# Patient Record
Sex: Female | Born: 1940 | Race: White | Hispanic: No | Marital: Married | Smoking: Never smoker
Health system: Southern US, Community
[De-identification: ages and names within clinical notes are randomized; demographics above are authoritative.]

## PROBLEM LIST (undated history)

## (undated) DIAGNOSIS — M1990 Unspecified osteoarthritis, unspecified site: Secondary | ICD-10-CM

## (undated) DIAGNOSIS — I219 Acute myocardial infarction, unspecified: Secondary | ICD-10-CM

## (undated) DIAGNOSIS — I2510 Atherosclerotic heart disease of native coronary artery without angina pectoris: Secondary | ICD-10-CM

## (undated) DIAGNOSIS — E7800 Pure hypercholesterolemia, unspecified: Secondary | ICD-10-CM

## (undated) DIAGNOSIS — F419 Anxiety disorder, unspecified: Secondary | ICD-10-CM

## (undated) DIAGNOSIS — I10 Essential (primary) hypertension: Secondary | ICD-10-CM

## (undated) HISTORY — DX: Unspecified osteoarthritis, unspecified site: M19.90

## (undated) HISTORY — PX: ANTERIOR (CYSTOCELE) AND POSTERIOR REPAIR (RECTOCELE) WITH XENFORM GRAFT AND SACROSPINOUS FIXATION: SHX6492

## (undated) HISTORY — DX: Anxiety disorder, unspecified: F41.9

## (undated) HISTORY — PX: CORONARY ANGIOPLASTY WITH STENT PLACEMENT: SHX49

## (undated) HISTORY — PX: BREAST CYST EXCISION: SHX579

## (undated) HISTORY — PX: VAGINAL HYSTERECTOMY: SUR661

## (undated) HISTORY — PX: CATARACT EXTRACTION: SUR2

## (undated) HISTORY — PX: ANTERIOR AND POSTERIOR VAGINAL REPAIR W/ SACROSPINOUS LIGAMENT SUSPENSION: SUR6

## (undated) HISTORY — PX: INCONTINENCE SURGERY: SHX676

## (undated) HISTORY — DX: Atherosclerotic heart disease of native coronary artery without angina pectoris: I25.10

---

## 2009-11-24 ENCOUNTER — Emergency Department (HOSPITAL_COMMUNITY): Admission: EM | Admit: 2009-11-24 | Discharge: 2009-11-24 | Payer: Self-pay | Admitting: Emergency Medicine

## 2011-01-01 ENCOUNTER — Emergency Department (HOSPITAL_BASED_OUTPATIENT_CLINIC_OR_DEPARTMENT_OTHER): Payer: Self-pay

## 2011-01-01 ENCOUNTER — Emergency Department (INDEPENDENT_AMBULATORY_CARE_PROVIDER_SITE_OTHER): Payer: Medicare Other

## 2011-01-01 ENCOUNTER — Emergency Department (HOSPITAL_BASED_OUTPATIENT_CLINIC_OR_DEPARTMENT_OTHER): Admission: EM | Admit: 2011-01-01 | Payer: Self-pay | Source: Home / Self Care

## 2011-01-01 ENCOUNTER — Encounter: Payer: Self-pay | Admitting: Family Medicine

## 2011-01-01 ENCOUNTER — Emergency Department (HOSPITAL_BASED_OUTPATIENT_CLINIC_OR_DEPARTMENT_OTHER)
Admission: EM | Admit: 2011-01-01 | Discharge: 2011-01-01 | Disposition: A | Discharge status: Home / Self Care | Payer: Medicare Other | Attending: Emergency Medicine | Admitting: Emergency Medicine

## 2011-01-01 DIAGNOSIS — R079 Chest pain, unspecified: Secondary | ICD-10-CM | POA: Insufficient documentation

## 2011-01-01 DIAGNOSIS — R0789 Other chest pain: Secondary | ICD-10-CM

## 2011-01-01 DIAGNOSIS — Z79899 Other long term (current) drug therapy: Secondary | ICD-10-CM | POA: Insufficient documentation

## 2011-01-01 DIAGNOSIS — I252 Old myocardial infarction: Secondary | ICD-10-CM | POA: Insufficient documentation

## 2011-01-01 DIAGNOSIS — I10 Essential (primary) hypertension: Secondary | ICD-10-CM | POA: Insufficient documentation

## 2011-01-01 DIAGNOSIS — W19XXXA Unspecified fall, initial encounter: Secondary | ICD-10-CM

## 2011-01-01 DIAGNOSIS — W010XXA Fall on same level from slipping, tripping and stumbling without subsequent striking against object, initial encounter: Secondary | ICD-10-CM | POA: Insufficient documentation

## 2011-01-01 DIAGNOSIS — E7800 Pure hypercholesterolemia, unspecified: Secondary | ICD-10-CM | POA: Insufficient documentation

## 2011-01-01 DIAGNOSIS — S20219A Contusion of unspecified front wall of thorax, initial encounter: Secondary | ICD-10-CM | POA: Insufficient documentation

## 2011-01-01 HISTORY — DX: Pure hypercholesterolemia, unspecified: E78.00

## 2011-01-01 HISTORY — DX: Acute myocardial infarction, unspecified: I21.9

## 2011-01-01 HISTORY — DX: Essential (primary) hypertension: I10

## 2011-01-01 MED ORDER — HYDROCODONE-ACETAMINOPHEN 5-325 MG PO TABS
1.0000 | ORAL_TABLET | Freq: Once | ORAL | Status: DC
  Filled 2011-01-01: qty 1

## 2011-01-01 MED ORDER — IBUPROFEN 400 MG PO TABS
600.0000 mg | ORAL_TABLET | Freq: Once | ORAL | Status: AC
  Administered 2011-01-01: 600 mg via ORAL
  Filled 2011-01-01: qty 1

## 2011-01-01 MED ORDER — HYDROCODONE-ACETAMINOPHEN 5-325 MG PO TABS: 1.0000 | ORAL_TABLET | Freq: Four times a day (QID) | ORAL | Status: AC | PRN

## 2011-01-01 NOTE — ED Notes (Signed)
Pt sts she "slipped and fell" injuring right ribs yesterday. Pt sts she also hit right side of head but denies loc, denies dizziness, headache. Pt able to ambulate.

## 2011-01-01 NOTE — ED Provider Notes (Signed)
History     CSN: [id number] Arrival date & time: 01/01/2011  8:18 AM   First MD Initiated Contact with Patient 01/01/11 0827      Chief Complaint  Patient presents with  . Fall     HPI  70 year old female presents with right rib pain after fall. Patient states that she slipped on a wet floor at her house. She fell onto her right side, mostly in her shoulder ribs. She states that she did her head but "not hard". There is no loss of consciousness/dizziness/headache. Denies nausea, vomiting. There is no neck pain, back pain. The patient is complaining of pain under right breast radiating to her right flank. Worse with movement and with breathing. The patient denies numbness tingling weakness of her extremities. She denies shortness of breath.  Denies headache, dizziness, cp, palpitations, shortness of breath pre fall and currently. No neck pain or back pain. Denies hip pain. Has been ambulatory since incident. Feels safe at home.    Letta Median, RN 01/01/2011 08:19     Pt sts she "slipped and fell" injuring right ribs yesterday. Pt sts she also hit right side of head but denies loc, denies dizziness, headache. Pt able to ambulate.     Past Medical History  Diagnosis Date  . Hypertension   . High cholesterol   . MI (myocardial infarction)     History reviewed. No pertinent past surgical history.  No family history on file.  History  Substance Use Topics  . Smoking status: Never Smoker   . Smokeless tobacco: Not on file  . Alcohol Use: No    OB History    Grav Para Term Preterm Abortions TAB SAB Ect Mult Living                  Review of Systems except as noted HPI  Allergies  Penicillins  Home Medications   Current Outpatient Rx  Name Route Sig Dispense Refill  . ALPRAZOLAM 0.25 MG PO TABS Oral Take 0.25 mg by mouth at bedtime as needed.      Marland Kitchen DICLOFENAC-MISOPROSTOL 75-0.2 MG PO TABS Oral Take 1 tablet by mouth 2 (two) times daily.      Marland Kitchen METOPROLOL  SUCCINATE 50 MG PO TB24 Oral Take 50 mg by mouth daily.      Marland Kitchen NITROGLYCERIN 0.4 MG SL SUBL Sublingual Place 0.4 mg under the tongue every 5 (five) minutes as needed.      Marland Kitchen RABEPRAZOLE SODIUM 20 MG PO TBEC Oral Take 20 mg by mouth daily.      Marland Kitchen RAMIPRIL 2.5 MG PO CAPS Oral Take 2.5 mg by mouth daily.      Marland Kitchen ROSUVASTATIN CALCIUM 10 MG PO TABS Oral Take 10 mg by mouth daily.      Marland Kitchen HYDROCODONE-ACETAMINOPHEN 5-325 MG PO TABS Oral Take 1 tablet by mouth every 6 (six) hours as needed for pain. 10 tablet 0    BP 144/67  Pulse 70  Temp(Src) 97.6 F (36.4 C) (Oral)  Resp 16  Ht 5\' 1"  (1.549 m)  Wt 141 lb (63.957 kg)  BMI 26.64 kg/m2  SpO2 100%  Physical Exam  Nursing note and vitals reviewed. Constitutional: She is oriented to person, place, and time. She appears well-developed.  HENT:  Head: Atraumatic.  Mouth/Throat: Oropharynx is clear and moist.  Eyes: Conjunctivae and EOM are normal. Pupils are equal, round, and reactive to light.  Neck: Normal range of motion. Neck supple.  Cardiovascular: Normal rate, regular  rhythm, normal heart sounds and intact distal pulses.   Pulmonary/Chest: Effort normal and breath sounds normal. No respiratory distress. She has no wheezes. She has no rales. She exhibits tenderness.       Diffuse Rt lower rib ttp  Abdominal: Soft. She exhibits no distension. There is no tenderness. There is no rebound and no guarding.  Musculoskeletal: Normal range of motion.  Neurological: She is alert and oriented to person, place, and time.  Skin: Skin is warm and dry. No rash noted.  Psychiatric: She has a normal mood and affect.    ED Course  Procedures (including critical care time)  Labs Reviewed - No data to display Dg Ribs Unilateral W/chest Right  01/01/2011  *RADIOLOGY REPORT*  Clinical Data: Fall.  Right sided rib and chest pain.  RIGHT RIBS AND CHEST - 3+ VIEW  Comparison: 11/24/2009  Findings: No acute right-sided rib fractures are identified. Old  fracture deformity of the right posterior 4th rib is again demonstrated.  No evidence of pneumothorax or hemothorax.  Both lungs are clear.  Heart size and mediastinal contours are stable and within normal limits.  Coronary artery stent again noted.  IMPRESSION:  1.  No acute right rib fractures identified.  Old right 4th rib fracture deformity again noted. 2.  No active cardiopulmonary disease.  Original Report Authenticated By: Danae Orleans, M.D.     1. Rib contusion   2. Fall       MDM  S/p mechanical fall with rib contusion. Pain control, IS. Home with PMD f/u  Stefano Gaul, MD         Forbes Cellar, MD 01/01/11 [id number]

## 2011-01-01 NOTE — ED Notes (Signed)
Pt refused IS. RN and MD aware.  States she can breath just fine no problems.  Explained to her it is important to deep breath and cough.

## 2011-01-01 NOTE — ED Notes (Signed)
Pt refused vicodin, MD aware and orders changed.

## 2014-11-28 DIAGNOSIS — I2510 Atherosclerotic heart disease of native coronary artery without angina pectoris: Secondary | ICD-10-CM | POA: Insufficient documentation

## 2014-11-28 DIAGNOSIS — E782 Mixed hyperlipidemia: Secondary | ICD-10-CM | POA: Insufficient documentation

## 2014-11-28 HISTORY — DX: Mixed hyperlipidemia: E78.2

## 2014-11-28 HISTORY — DX: Atherosclerotic heart disease of native coronary artery without angina pectoris: I25.10

## 2014-11-29 DIAGNOSIS — N390 Urinary tract infection, site not specified: Secondary | ICD-10-CM

## 2014-11-29 DIAGNOSIS — N393 Stress incontinence (female) (male): Secondary | ICD-10-CM

## 2014-11-29 DIAGNOSIS — N816 Rectocele: Secondary | ICD-10-CM

## 2014-11-29 DIAGNOSIS — N8110 Cystocele, unspecified: Secondary | ICD-10-CM | POA: Insufficient documentation

## 2014-11-29 HISTORY — DX: Rectocele: N81.6

## 2014-11-29 HISTORY — DX: Stress incontinence (female) (male): N39.3

## 2014-11-29 HISTORY — DX: Cystocele, unspecified: N81.10

## 2014-11-29 HISTORY — DX: Urinary tract infection, site not specified: N39.0

## 2015-07-01 DIAGNOSIS — E559 Vitamin D deficiency, unspecified: Secondary | ICD-10-CM

## 2015-07-01 DIAGNOSIS — F5101 Primary insomnia: Secondary | ICD-10-CM

## 2015-07-01 HISTORY — DX: Primary insomnia: F51.01

## 2015-07-01 HISTORY — DX: Vitamin D deficiency, unspecified: E55.9

## 2016-01-07 DIAGNOSIS — K219 Gastro-esophageal reflux disease without esophagitis: Secondary | ICD-10-CM | POA: Insufficient documentation

## 2016-01-07 DIAGNOSIS — Z13820 Encounter for screening for osteoporosis: Secondary | ICD-10-CM

## 2016-01-07 HISTORY — DX: Encounter for screening for osteoporosis: Z13.820

## 2016-01-07 HISTORY — DX: Gastro-esophageal reflux disease without esophagitis: K21.9

## 2016-06-22 ENCOUNTER — Ambulatory Visit (HOSPITAL_BASED_OUTPATIENT_CLINIC_OR_DEPARTMENT_OTHER)
Admission: RE | Admit: 2016-06-22 | Discharge: 2016-06-22 | Disposition: A | Discharge status: Home / Self Care | Payer: Medicare Other | Source: Ambulatory Visit | Attending: Chiropractic Medicine | Admitting: Chiropractic Medicine

## 2016-06-22 ENCOUNTER — Other Ambulatory Visit (HOSPITAL_BASED_OUTPATIENT_CLINIC_OR_DEPARTMENT_OTHER): Payer: Self-pay | Admitting: Chiropractic Medicine

## 2016-06-22 DIAGNOSIS — M9905 Segmental and somatic dysfunction of pelvic region: Secondary | ICD-10-CM | POA: Insufficient documentation

## 2016-06-22 DIAGNOSIS — M25551 Pain in right hip: Secondary | ICD-10-CM

## 2016-06-22 DIAGNOSIS — R262 Difficulty in walking, not elsewhere classified: Secondary | ICD-10-CM

## 2016-07-09 DIAGNOSIS — M25551 Pain in right hip: Secondary | ICD-10-CM | POA: Insufficient documentation

## 2016-11-17 ENCOUNTER — Encounter: Payer: Self-pay | Admitting: Cardiology

## 2016-11-17 ENCOUNTER — Ambulatory Visit (INDEPENDENT_AMBULATORY_CARE_PROVIDER_SITE_OTHER): Payer: Medicare Other | Admitting: Cardiology

## 2016-11-17 VITALS — BP 118/62 | HR 60 | Resp 10 | Ht 61.0 in | Wt 142.8 lb

## 2016-11-17 DIAGNOSIS — I2510 Atherosclerotic heart disease of native coronary artery without angina pectoris: Secondary | ICD-10-CM | POA: Diagnosis not present

## 2016-11-17 DIAGNOSIS — E782 Mixed hyperlipidemia: Secondary | ICD-10-CM

## 2016-11-17 DIAGNOSIS — K219 Gastro-esophageal reflux disease without esophagitis: Secondary | ICD-10-CM | POA: Diagnosis not present

## 2016-11-17 NOTE — Progress Notes (Signed)
Cardiology Office Note:    Date:  11/17/2016   ID:  Haley Walter, DOB [birth year], MRN [id number]  PCP:  Kristopher Glee., MD  Cardiologist:  Jenne Campus, MD    Referring MD: Kristopher Glee., MD   Chief Complaint  Patient presents with  . Follow-up  I'm doing well  History of Present Illness:    Haley Walter is a 76 y.o. female  with cardiomyopathy and coronary artery disease. Asymptomatic still very active. Denies having any chest pain tightness squeezing pressure in chest.  Past Medical History:  Diagnosis Date  . Anxiety   . Arthritis   . Coronary artery disease   . High cholesterol   . Hypertension   . MI (myocardial infarction) [facility])     Past Surgical History:  Procedure Laterality Date  . ANTERIOR (CYSTOCELE) AND POSTERIOR REPAIR (RECTOCELE) WITH XENFORM GRAFT AND SACROSPINOUS FIXATION    . ANTERIOR AND POSTERIOR VAGINAL REPAIR W/ SACROSPINOUS LIGAMENT SUSPENSION    . BREAST CYST EXCISION    . CORONARY ANGIOPLASTY WITH STENT PLACEMENT    . INCONTINENCE SURGERY    . VAGINAL HYSTERECTOMY      Current Medications: Current Meds  Medication Sig  . ALPRAZolam (XANAX) 0.25 MG tablet Take 0.25 mg by mouth at bedtime as needed.    Marland Kitchen aspirin EC 81 MG tablet Take 81 mg by mouth daily.  Marland Kitchen atorvastatin (LIPITOR) 40 MG tablet Take 40 mg by mouth daily.  . Calcium Carb-Cholecalciferol (CALCIUM-VITAMIN D) 500-200 MG-UNIT tablet Take 1 tablet by mouth daily.  . diclofenac sodium (VOLTAREN) 1 % GEL Apply 1 application topically 3 (three) times daily.  . diclofenac-misoprostol (ARTHROTEC 75) 75-0.2 MG per tablet Take 1 tablet by mouth 2 (two) times daily.    . metoprolol succinate (TOPROL-XL) 50 MG 24 hr tablet Take 75 mg by mouth daily.  . nitroGLYCERIN (NITROSTAT) 0.4 MG SL tablet Place 0.4 mg under the tongue every 5 (five) minutes as needed.    . pantoprazole (PROTONIX) 40 MG tablet Take 40 mg by mouth daily.  . RABEprazole (ACIPHEX) 20 MG tablet Take 20  mg by mouth daily.    . ramipril (ALTACE) 10 MG capsule Take 10 mg by mouth daily.  . risedronate (ACTONEL) 150 MG tablet Take 150 mg by mouth daily.     Allergies:   Penicillins   Social History   Social History  . Marital status: Single    Spouse name: N/A  . Number of children: N/A  . Years of education: N/A   Social History Main Topics  . Smoking status: Never Smoker  . Smokeless tobacco: Never Used  . Alcohol use Yes  . Drug use: No  . Sexual activity: Not Asked   Other Topics Concern  . None   Social History Narrative  . None     Family History: The patient's family history includes Cancer in her mother; Heart disease in her father and maternal aunt; Stroke in her father. ROS:   Please see the history of present illness.    All 14 point review of systems negative except as described per history of present illness  EKGs/Labs/Other Studies Reviewed:      Recent Labs: No results found for requested labs within last 8760 hours.  Recent Lipid Panel No results found for: CHOL, TRIG, HDL, CHOLHDL, VLDL, LDLCALC, LDLDIRECT  Physical Exam:    VS:  BP 118/62   Pulse 60   Resp 10   Ht 5\' 1"  (1.549 m)  Wt 142 lb 12.8 oz (64.8 kg)   BMI 26.98 kg/m     Wt Readings from Last 3 Encounters:  11/17/16 142 lb 12.8 oz (64.8 kg)  01/01/11 141 lb (64 kg)     GEN:  Well nourished, well developed in no acute distress HEENT: Normal NECK: No JVD; No carotid bruits LYMPHATICS: No lymphadenopathy CARDIAC: RRR, no murmurs, no rubs, no gallops RESPIRATORY:  Clear to auscultation without rales, wheezing or rhonchi  ABDOMEN: Soft, non-tender, non-distended MUSCULOSKELETAL:  No edema; No deformity  SKIN: Warm and dry LOWER EXTREMITIES: no swelling NEUROLOGIC:  Alert and oriented x 3 PSYCHIATRIC:  Normal affect   ASSESSMENT:    1. Mixed hyperlipidemia   2. Coronary artery disease involving native coronary artery of native heart without angina pectoris   3.  Gastroesophageal reflux disease without esophagitis    PLAN:    In order of problems listed above:  1. Dyslipidemia: We'll check fasting lipid profile today. 2. Coronary artery disease: Doing well, asymptomatic, will do EKG today for baseline 3. GERD: Stable asymptomatic   Medication Adjustments/Labs and Tests Ordered: Current medicines are reviewed at length with the patient today.  Concerns regarding medicines are outlined above.  Orders Placed This Encounter  Procedures  . Lipid panel  . EKG 12-Lead   Medication changes: No orders of the defined types were placed in this encounter.   Signed, Park Liter, MD, [facility] 11/17/2016 4:10 PM    [place]

## 2016-11-17 NOTE — Patient Instructions (Addendum)
Medication Instructions:  Your physician recommends that you continue on your current medications as directed. Please refer to the Current Medication list given to you today.  1. Avoid all over-the-counter antihistamines except Claritin/Loratadine and Zyrtec/Cetrizine. 2. Avoid all combination including cold sinus allergies flu decongestant and sleep medications 3. You can use Robitussin DM Mucinex and Mucinex DM for cough. 4. can use Tylenol aspirin ibuprofen and naproxen but no combinations such as sleep or sinus.  Labwork: Your physician recommends that you return for a FASTING lipid profile: today  Testing/Procedures: EKG today in office.   Follow-Up: Your physician recommends that you schedule a follow-up appointment in: 5 months   Any Other Special Instructions Will Be Listed Below (If Applicable).  Please note that any paperwork needing to be filled out by the provider will need to be addressed at the front desk prior to seeing the provider. Please note that any paperwork FMLA, Disability or other documents regarding health condition is subject to a $25.00 charge that must be received prior to completion of paperwork in the form of a money order or check.    If you need a refill on your cardiac medications before your next appointment, please call your pharmacy.

## 2016-11-18 LAB — LIPID PANEL
Chol/HDL Ratio: 3.2 ratio (ref 0.0–4.4)
Cholesterol, Total: 198 mg/dL (ref 100–199)
HDL: 61 mg/dL (ref >39)
LDL Calculated: 88 mg/dL (ref 0–99)
Triglycerides: 245 mg/dL — ABNORMAL HIGH (ref 0–149)
VLDL CHOLESTEROL CAL: 49 mg/dL — AB (ref 5–40)

## 2016-11-19 ENCOUNTER — Other Ambulatory Visit: Payer: Self-pay

## 2016-11-19 MED ORDER — METOPROLOL SUCCINATE ER 50 MG PO TB24: ORAL_TABLET | ORAL | 2 refills | Status: DC

## 2016-11-19 MED ORDER — ROSUVASTATIN CALCIUM 40 MG PO TABS: 40.0000 mg | ORAL_TABLET | Freq: Every day | ORAL | 3 refills | Status: DC

## 2016-11-19 MED ORDER — ALPRAZOLAM 0.25 MG PO TABS: 0.2500 mg | ORAL_TABLET | Freq: Every evening | ORAL | 0 refills | Status: DC | PRN

## 2017-02-11 ENCOUNTER — Other Ambulatory Visit: Payer: Self-pay

## 2017-02-11 MED ORDER — ATORVASTATIN CALCIUM 40 MG PO TABS: 40.0000 mg | ORAL_TABLET | Freq: Every day | ORAL | 1 refills | Status: DC

## 2017-02-11 MED ORDER — RAMIPRIL 10 MG PO CAPS: 10.0000 mg | ORAL_CAPSULE | Freq: Every day | ORAL | 1 refills | Status: DC

## 2017-02-15 ENCOUNTER — Other Ambulatory Visit: Payer: Self-pay

## 2017-02-15 MED ORDER — ATORVASTATIN CALCIUM 40 MG PO TABS: 40.0000 mg | ORAL_TABLET | Freq: Every day | ORAL | 1 refills | Status: DC

## 2017-02-24 ENCOUNTER — Encounter: Payer: Self-pay | Admitting: Cardiology

## 2017-02-24 ENCOUNTER — Ambulatory Visit: Payer: Medicare Other | Admitting: Cardiology

## 2017-02-24 VITALS — BP 122/68 | HR 69 | Ht 61.0 in | Wt 146.1 lb

## 2017-02-24 DIAGNOSIS — E782 Mixed hyperlipidemia: Secondary | ICD-10-CM

## 2017-02-24 DIAGNOSIS — K219 Gastro-esophageal reflux disease without esophagitis: Secondary | ICD-10-CM

## 2017-02-24 DIAGNOSIS — I2510 Atherosclerotic heart disease of native coronary artery without angina pectoris: Secondary | ICD-10-CM

## 2017-02-24 DIAGNOSIS — I10 Essential (primary) hypertension: Secondary | ICD-10-CM | POA: Diagnosis not present

## 2017-02-24 HISTORY — DX: Essential (primary) hypertension: I10

## 2017-02-24 MED ORDER — AMLODIPINE BESYLATE 2.5 MG PO TABS: 2.5000 mg | ORAL_TABLET | Freq: Every day | ORAL | 1 refills | Status: DC

## 2017-02-24 NOTE — Patient Instructions (Signed)
Medication Instructions:  Your physician recommends that you continue on your current medications as directed. Please refer to the Current Medication list given to you today.  Labwork: None  Testing/Procedures: Your physician has requested that you have a carotid duplex. This test is an ultrasound of the carotid arteries in your neck. It looks at blood flow through these arteries that supply the brain with blood. Allow one hour for this exam. There are no restrictions or special instructions.  Follow-Up: Your physician recommends that you schedule a follow-up appointment in: 1 months  Any Other Special Instructions Will Be Listed Below (If Applicable).  Please go to see your eye doctor   If you need a refill on your cardiac medications before your next appointment, please call your pharmacy.   Horse Pasture, RN, BSN

## 2017-02-24 NOTE — Progress Notes (Signed)
Cardiology Office Note:    Date:  02/24/2017   ID:  Haley Walter, DOB [birth year], MRN [id number]  PCP:  Kristopher Glee., MD  Cardiologist:  Jenne Campus, MD    Referring MD: Kristopher Glee., MD   Chief Complaint  Patient presents with  . Hypertension  Had problem with my vision  History of Present Illness:    Haley Walter is a 77 y.o. female with coronary artery disease.  She described 2 episodes when she started having blurred vision she was sitting started having blurred vision she check her blood pressure which was very high at one time more than 983 systolic.  She took some Xanax as well as some medication to drop her blood pressure and after that things returned to normal.  Denies having any chest pain tightness squeezing pressure burning chest but described to have some GERD-like symptoms when she eats them she lays down.  He goes to gym on the regular basis is able to exercise with no difficulties  Past Medical History:  Diagnosis Date  . Anxiety   . Arthritis   . Coronary artery disease   . High cholesterol   . Hypertension   . MI (myocardial infarction) [facility])     Past Surgical History:  Procedure Laterality Date  . ANTERIOR (CYSTOCELE) AND POSTERIOR REPAIR (RECTOCELE) WITH XENFORM GRAFT AND SACROSPINOUS FIXATION    . ANTERIOR AND POSTERIOR VAGINAL REPAIR W/ SACROSPINOUS LIGAMENT SUSPENSION    . BREAST CYST EXCISION    . CORONARY ANGIOPLASTY WITH STENT PLACEMENT    . INCONTINENCE SURGERY    . VAGINAL HYSTERECTOMY      Current Medications: Current Meds  Medication Sig  . ALPRAZolam (XANAX) 0.25 MG tablet Take 1 tablet (0.25 mg total) by mouth at bedtime as needed.  Marland Kitchen aspirin EC 81 MG tablet Take 81 mg by mouth daily.  Marland Kitchen atorvastatin (LIPITOR) 40 MG tablet Take 1 tablet (40 mg total) by mouth daily.  . Calcium Carb-Cholecalciferol (CALCIUM-VITAMIN D) 500-200 MG-UNIT tablet Take 1 tablet by mouth daily.  . diclofenac sodium (VOLTAREN) 1 % GEL  Apply 1 application topically 3 (three) times daily.  . diclofenac-misoprostol (ARTHROTEC 75) 75-0.2 MG per tablet Take 1 tablet by mouth 2 (two) times daily.    . metoprolol succinate (TOPROL-XL) 50 MG 24 hr tablet Take 1 and 1/2 tablets by mouth daily  . nitroGLYCERIN (NITROSTAT) 0.4 MG SL tablet Place 0.4 mg under the tongue every 5 (five) minutes as needed.    . pantoprazole (PROTONIX) 40 MG tablet Take 40 mg by mouth daily.  . RABEprazole (ACIPHEX) 20 MG tablet Take 20 mg by mouth daily.    . ramipril (ALTACE) 10 MG capsule Take 1 capsule (10 mg total) by mouth daily.  . risedronate (ACTONEL) 150 MG tablet Take 150 mg by mouth daily.     Allergies:   Penicillins   Social History   Socioeconomic History  . Marital status: Single    Spouse name: None  . Number of children: None  . Years of education: None  . Highest education level: None  Social Needs  . Financial resource strain: None  . Food insecurity - worry: None  . Food insecurity - inability: None  . Transportation needs - medical: None  . Transportation needs - non-medical: None  Occupational History  . None  Tobacco Use  . Smoking status: Never Smoker  . Smokeless tobacco: Never Used  Substance and Sexual Activity  . Alcohol use: Yes  .  Drug use: No  . Sexual activity: None  Other Topics Concern  . None  Social History Narrative  . None     Family History: The patient's family history includes Cancer in her mother; Heart disease in her father and maternal aunt; Stroke in her father. ROS:   Please see the history of present illness.    All 14 point review of systems negative except as described per history of present illness  EKGs/Labs/Other Studies Reviewed:      Recent Labs: No results found for requested labs within last 8760 hours.  Recent Lipid Panel    Component Value Date/Time   CHOL 198 11/17/2016 1539   TRIG 245 (H) 11/17/2016 1539   HDL 61 11/17/2016 1539   CHOLHDL 3.2 11/17/2016 1539    LDLCALC 88 11/17/2016 1539    Physical Exam:    VS:  BP 122/68   Pulse 69   Ht 5\' 1"  (1.549 m)   Wt 146 lb 1.9 oz (66.3 kg)   SpO2 96%   BMI 27.61 kg/m     Wt Readings from Last 3 Encounters:  02/24/17 146 lb 1.9 oz (66.3 kg)  11/17/16 142 lb 12.8 oz (64.8 kg)  01/01/11 141 lb (64 kg)     GEN:  Well nourished, well developed in no acute distress HEENT: Normal NECK: No JVD; No carotid bruits LYMPHATICS: No lymphadenopathy CARDIAC: RRR, no murmurs, no rubs, no gallops RESPIRATORY:  Clear to auscultation without rales, wheezing or rhonchi  ABDOMEN: Soft, non-tender, non-distended MUSCULOSKELETAL:  No edema; No deformity  SKIN: Warm and dry LOWER EXTREMITIES: no swelling NEUROLOGIC:  Alert and oriented x 3 PSYCHIATRIC:  Normal affect   ASSESSMENT:    1. Coronary artery disease involving native coronary artery of native heart without angina pectoris   2. Gastroesophageal reflux disease without esophagitis   3. Mixed hyperlipidemia   4. Essential hypertension    PLAN:    In order of problems listed above:  1. Coronary artery disease: Appears to be stable and appropriate medications which I will continue 2. Gastroesophageal reflux disease: I will ask her to use some antacids for that 3. Dyslipidemia we will continue present management. 4. Hypertension: She brought results of her blood pressure measurements that were done within the last few days it is elevated I will initiate Norvasc 2.5 mg daily. 5. Vision problem: I asked her to have carotid ultrasounds but I doubt that this is related to vascular issues I recommended for her to see ophthalmologist to make sure she does not develop glaucoma.  On top of that she takes Actonel which does have some potential eye related side effects and asked her to talk to her primary care physician regarding that issue.   Medication Adjustments/Labs and Tests Ordered: Current medicines are reviewed at length with the patient today.   Concerns regarding medicines are outlined above.  No orders of the defined types were placed in this encounter.  Medication changes: No orders of the defined types were placed in this encounter.   Signed, Park Liter, MD, [facility] 02/24/2017 10:21 AM    [place]

## 2017-02-24 NOTE — Addendum Note (Signed)
Addended by: Aleatha Borer on: 02/24/2017 11:07 AM   Modules accepted: Orders

## 2017-03-16 ENCOUNTER — Ambulatory Visit (HOSPITAL_BASED_OUTPATIENT_CLINIC_OR_DEPARTMENT_OTHER)
Admission: RE | Admit: 2017-03-16 | Discharge: 2017-03-16 | Disposition: A | Discharge status: Home / Self Care | Payer: Medicare Other | Source: Ambulatory Visit | Attending: Cardiology | Admitting: Cardiology

## 2017-03-16 DIAGNOSIS — I739 Peripheral vascular disease, unspecified: Secondary | ICD-10-CM | POA: Diagnosis present

## 2017-03-16 DIAGNOSIS — E782 Mixed hyperlipidemia: Secondary | ICD-10-CM

## 2017-03-16 DIAGNOSIS — H539 Unspecified visual disturbance: Secondary | ICD-10-CM | POA: Diagnosis present

## 2017-03-16 DIAGNOSIS — I2510 Atherosclerotic heart disease of native coronary artery without angina pectoris: Secondary | ICD-10-CM | POA: Diagnosis not present

## 2017-03-16 DIAGNOSIS — I10 Essential (primary) hypertension: Secondary | ICD-10-CM | POA: Diagnosis not present

## 2017-03-16 DIAGNOSIS — I6522 Occlusion and stenosis of left carotid artery: Secondary | ICD-10-CM | POA: Insufficient documentation

## 2017-03-16 DIAGNOSIS — K219 Gastro-esophageal reflux disease without esophagitis: Secondary | ICD-10-CM

## 2017-03-16 LAB — VAS US CAROTID
LCCADDIAS: 16 cm/s
LCCAPSYS: 82 cm/s
LEFT ECA DIAS: -15 cm/s
LEFT VERTEBRAL DIAS: -16 cm/s
LICADSYS: -69 cm/s
Left CCA dist sys: 64 cm/s
Left CCA prox dias: 19 cm/s
Left ICA dist dias: -21 cm/s
Left ICA prox dias: -18 cm/s
Left ICA prox sys: -65 cm/s
RCCADSYS: -66 cm/s
RCCAPDIAS: 17 cm/s
RIGHT ECA DIAS: -11 cm/s
RIGHT VERTEBRAL DIAS: -8 cm/s
Right CCA prox sys: 85 cm/s

## 2017-03-16 NOTE — Progress Notes (Signed)
Complete carotid duplex performed. Normal exam  Jimmy Jamaree Hosier RDCS 

## 2017-03-29 ENCOUNTER — Ambulatory Visit (INDEPENDENT_AMBULATORY_CARE_PROVIDER_SITE_OTHER): Payer: Medicare Other | Admitting: Cardiology

## 2017-03-29 ENCOUNTER — Encounter: Payer: Self-pay | Admitting: Cardiology

## 2017-03-29 VITALS — BP 100/56 | HR 93 | Ht 61.0 in | Wt 144.0 lb

## 2017-03-29 DIAGNOSIS — E782 Mixed hyperlipidemia: Secondary | ICD-10-CM

## 2017-03-29 DIAGNOSIS — I2510 Atherosclerotic heart disease of native coronary artery without angina pectoris: Secondary | ICD-10-CM | POA: Diagnosis not present

## 2017-03-29 DIAGNOSIS — I10 Essential (primary) hypertension: Secondary | ICD-10-CM

## 2017-03-29 MED ORDER — AZITHROMYCIN 250 MG PO TABS: ORAL_TABLET | ORAL | 0 refills | Status: DC

## 2017-03-29 MED ORDER — BENZONATATE 100 MG PO CAPS: 100.0000 mg | ORAL_CAPSULE | Freq: Three times a day (TID) | ORAL | 0 refills | Status: DC | PRN

## 2017-03-29 NOTE — Progress Notes (Signed)
Cardiology Office Note:    Date:  03/29/2017   ID:  Haley Walter, DOB May 06, 1940, MRN [id number]  PCP:  Kristopher Glee., MD  Cardiologist:  Jenne Campus, MD    Referring MD: Kristopher Glee., MD   Chief Complaint  Patient presents with  . Follow-up  Cardiac wise doing well but does have cold symptoms  History of Present Illness:    Haley Walter is a 77 y.o. female with coronary artery disease.  Overall doing well denies having a chest pain tightness squeezing pressure burning chest.  Last time I saw her she described to have some strange my symptoms.  Carotid ultrasounds was done which was negative I recommended for her to go to ophthalmologist but she did not do it since she does not have any more of those episodes.  As having a chest pain tightness squeezing pressure burning does have cold like symptoms including some fever and stuffy nose.  She also complained of having some sinus problem.  Past Medical History:  Diagnosis Date  . Anxiety   . Arthritis   . Coronary artery disease   . High cholesterol   . Hypertension   . MI (myocardial infarction) [facility])     Past Surgical History:  Procedure Laterality Date  . ANTERIOR (CYSTOCELE) AND POSTERIOR REPAIR (RECTOCELE) WITH XENFORM GRAFT AND SACROSPINOUS FIXATION    . ANTERIOR AND POSTERIOR VAGINAL REPAIR W/ SACROSPINOUS LIGAMENT SUSPENSION    . BREAST CYST EXCISION    . CORONARY ANGIOPLASTY WITH STENT PLACEMENT    . INCONTINENCE SURGERY    . VAGINAL HYSTERECTOMY      Current Medications: Current Meds  Medication Sig  . ALPRAZolam (XANAX) 0.25 MG tablet Take 1 tablet (0.25 mg total) by mouth at bedtime as needed.  Marland Kitchen amLODipine (NORVASC) 2.5 MG tablet Take 1 tablet (2.5 mg total) by mouth daily.  Marland Kitchen aspirin EC 81 MG tablet Take 81 mg by mouth daily.  Marland Kitchen atorvastatin (LIPITOR) 40 MG tablet Take 1 tablet (40 mg total) by mouth daily.  . Calcium Carb-Cholecalciferol (CALCIUM-VITAMIN D) 500-200 MG-UNIT tablet  Take 1 tablet by mouth daily.  . diclofenac sodium (VOLTAREN) 1 % GEL Apply 1 application topically 3 (three) times daily.  . diclofenac-misoprostol (ARTHROTEC 75) 75-0.2 MG per tablet Take 1 tablet by mouth 2 (two) times daily.    . metoprolol succinate (TOPROL-XL) 50 MG 24 hr tablet Take 1 and 1/2 tablets by mouth daily  . nitroGLYCERIN (NITROSTAT) 0.4 MG SL tablet Place 0.4 mg under the tongue every 5 (five) minutes as needed.    . pantoprazole (PROTONIX) 40 MG tablet Take 40 mg by mouth daily.  . RABEprazole (ACIPHEX) 20 MG tablet Take 20 mg by mouth daily.    . ramipril (ALTACE) 10 MG capsule Take 1 capsule (10 mg total) by mouth daily.  . risedronate (ACTONEL) 150 MG tablet Take 150 mg by mouth daily.     Allergies:   Penicillins   Social History   Socioeconomic History  . Marital status: Married    Spouse name: None  . Number of children: None  . Years of education: None  . Highest education level: None  Social Needs  . Financial resource strain: None  . Food insecurity - worry: None  . Food insecurity - inability: None  . Transportation needs - medical: None  . Transportation needs - non-medical: None  Occupational History  . None  Tobacco Use  . Smoking status: Never Smoker  . Smokeless tobacco: Never  Used  Substance and Sexual Activity  . Alcohol use: Yes  . Drug use: No  . Sexual activity: None  Other Topics Concern  . None  Social History Narrative  . None     Family History: The patient's family history includes Cancer in her mother; Heart disease in her father and maternal aunt; Stroke in her father. ROS:   Please see the history of present illness.    All 14 point review of systems negative except as described per history of present illness  EKGs/Labs/Other Studies Reviewed:      Recent Labs: No results found for requested labs within last 8760 hours.  Recent Lipid Panel    Component Value Date/Time   CHOL 198 11/17/2016 1539   TRIG 245 (H)  11/17/2016 1539   HDL 61 11/17/2016 1539   CHOLHDL 3.2 11/17/2016 1539   LDLCALC 88 11/17/2016 1539    Physical Exam:    VS:  BP (!) 100/56 (BP Location: Left Arm, Patient Position: Sitting, Cuff Size: Normal)   Pulse 93   Ht 5\' 1"  (1.549 m)   Wt 144 lb (65.3 kg)   SpO2 98%   BMI 27.21 kg/m     Wt Readings from Last 3 Encounters:  03/29/17 144 lb (65.3 kg)  02/24/17 146 lb 1.9 oz (66.3 kg)  11/17/16 142 lb 12.8 oz (64.8 kg)     GEN:  Well nourished, well developed in no acute distress HEENT: Normal NECK: No JVD; No carotid bruits LYMPHATICS: No lymphadenopathy CARDIAC: RRR, no murmurs, no rubs, no gallops RESPIRATORY:  Clear to auscultation without rales, wheezing or rhonchi  ABDOMEN: Soft, non-tender, non-distended MUSCULOSKELETAL:  No edema; No deformity  SKIN: Warm and dry LOWER EXTREMITIES: no swelling NEUROLOGIC:  Alert and oriented x 3 PSYCHIATRIC:  Normal affect   ASSESSMENT:    1. Coronary artery disease involving native coronary artery of native heart without angina pectoris   2. Essential hypertension   3. Mixed hyperlipidemia    PLAN:    In order of problems listed above:  1. Coronary artery disease stable on appropriate medication which I will continue. 2. Essential hypertension: Doing well we will continue present management. 3. Dyslipidemia: On high intensity statin which I will continue. 4. Sinusitis with cold-like symptoms.  I will send prescription for Zithromax I will also give her Tessalon Perles.  I told her if she not feels better within next 2 days she need to go to her primary care physician   Medication Adjustments/Labs and Tests Ordered: Current medicines are reviewed at length with the patient today.  Concerns regarding medicines are outlined above.  No orders of the defined types were placed in this encounter.  Medication changes: No orders of the defined types were placed in this encounter.   Signed, Park Liter, MD,  [facility] 03/29/2017 10:29 AM    [place]

## 2017-03-29 NOTE — Patient Instructions (Signed)
Medication Instructions:  Your physician has recommended you make the following change in your medication:  Start Azithromycin 250 mg, 2 tablets on the first day, then 1 tablet starting the 2nd day until you have finished Start Tessalon Perles 1 capsule 3 times daily as needed for a cough  Labwork: None ordered  Testing/Procedures: None ordered  Follow-Up: Your physician recommends that you schedule a follow-up appointment in: 3 months with Dr. Agustin Cree   Any Other Special Instructions Will Be Listed Below (If Applicable).     If you need a refill on your cardiac medications before your next appointment, please call your pharmacy.

## 2017-05-11 ENCOUNTER — Other Ambulatory Visit: Payer: Self-pay | Admitting: Cardiology

## 2017-07-02 ENCOUNTER — Encounter: Payer: Self-pay | Admitting: Cardiology

## 2017-07-02 ENCOUNTER — Ambulatory Visit: Payer: Medicare Other | Admitting: Cardiology

## 2017-07-02 VITALS — BP 126/72 | HR 71 | Ht 61.0 in | Wt 144.0 lb

## 2017-07-02 DIAGNOSIS — I10 Essential (primary) hypertension: Secondary | ICD-10-CM

## 2017-07-02 DIAGNOSIS — E782 Mixed hyperlipidemia: Secondary | ICD-10-CM | POA: Diagnosis not present

## 2017-07-02 DIAGNOSIS — I2510 Atherosclerotic heart disease of native coronary artery without angina pectoris: Secondary | ICD-10-CM

## 2017-07-02 NOTE — Progress Notes (Signed)
Cardiology Office Note:    Date:  07/02/2017   ID:  Haley Walter, DOB 29-May-1940, MRN [id number]  PCP:  Kristopher Glee., MD  Cardiologist:  Jenne Campus, MD    Referring MD: Kristopher Glee., MD   Chief Complaint  Patient presents with  . Follow-up  . Coronary Artery Disease  Doing well  History of Present Illness:    Haley Walter is a 77 y.o. female with coronary artery disease.  Denies have any issues no shortness of breath chest pain tightness squeezing pressure burning chest still very active and looks good for somebody who is 70 inches tall.  She brought her cholesterol profile with her her LDL is 71.  We talked about potentially increasing medication she does not want to do that  Past Medical History:  Diagnosis Date  . Anxiety   . Arthritis   . Coronary artery disease   . High cholesterol   . Hypertension   . MI (myocardial infarction) [facility])     Past Surgical History:  Procedure Laterality Date  . ANTERIOR (CYSTOCELE) AND POSTERIOR REPAIR (RECTOCELE) WITH XENFORM GRAFT AND SACROSPINOUS FIXATION    . ANTERIOR AND POSTERIOR VAGINAL REPAIR W/ SACROSPINOUS LIGAMENT SUSPENSION    . BREAST CYST EXCISION    . CORONARY ANGIOPLASTY WITH STENT PLACEMENT    . INCONTINENCE SURGERY    . VAGINAL HYSTERECTOMY      Current Medications: Current Meds  Medication Sig  . ALPRAZolam (XANAX) 0.25 MG tablet Take 1 tablet (0.25 mg total) by mouth at bedtime as needed.  Marland Kitchen aspirin EC 81 MG tablet Take 81 mg by mouth daily.  Marland Kitchen atorvastatin (LIPITOR) 40 MG tablet Take 1 tablet (40 mg total) by mouth daily.  . benzonatate (TESSALON PERLES) 100 MG capsule Take 1 capsule (100 mg total) by mouth 3 (three) times daily as needed for cough.  . Calcium Carb-Cholecalciferol (CALCIUM-VITAMIN D) 500-200 MG-UNIT tablet Take 1 tablet by mouth daily.  . diclofenac sodium (VOLTAREN) 1 % GEL Apply 1 application topically 3 (three) times daily.  . diclofenac-misoprostol (ARTHROTEC 75)  75-0.2 MG per tablet Take 1 tablet by mouth 2 (two) times daily.    . metoprolol succinate (TOPROL-XL) 50 MG 24 hr tablet Take 1 and 1/2 tablets by mouth daily  . nitroGLYCERIN (NITROSTAT) 0.4 MG SL tablet Place 0.4 mg under the tongue every 5 (five) minutes as needed.    . pantoprazole (PROTONIX) 40 MG tablet Take 40 mg by mouth daily.  . RABEprazole (ACIPHEX) 20 MG tablet Take 20 mg by mouth daily.    . ramipril (ALTACE) 10 MG capsule Take 1 capsule (10 mg total) by mouth daily.  . risedronate (ACTONEL) 150 MG tablet Take 150 mg by mouth daily.     Allergies:   Penicillins   Social History   Socioeconomic History  . Marital status: Married    Spouse name: Not on file  . Number of children: Not on file  . Years of education: Not on file  . Highest education level: Not on file  Occupational History  . Not on file  Social Needs  . Financial resource strain: Not on file  . Food insecurity:    Worry: Not on file    Inability: Not on file  . Transportation needs:    Medical: Not on file    Non-medical: Not on file  Tobacco Use  . Smoking status: Never Smoker  . Smokeless tobacco: Never Used  Substance and Sexual Activity  . Alcohol  use: Yes  . Drug use: No  . Sexual activity: Not on file  Lifestyle  . Physical activity:    Days per week: Not on file    Minutes per session: Not on file  . Stress: Not on file  Relationships  . Social connections:    Talks on phone: Not on file    Gets together: Not on file    Attends religious service: Not on file    Active member of club or organization: Not on file    Attends meetings of clubs or organizations: Not on file    Relationship status: Not on file  Other Topics Concern  . Not on file  Social History Narrative  . Not on file     Family History: The patient's family history includes Cancer in her mother; Heart disease in her father and maternal aunt; Stroke in her father. ROS:   Please see the history of present illness.     All 14 point review of systems negative except as described per history of present illness  EKGs/Labs/Other Studies Reviewed:      Recent Labs: No results found for requested labs within last 8760 hours.  Recent Lipid Panel    Component Value Date/Time   CHOL 198 11/17/2016 1539   TRIG 245 (H) 11/17/2016 1539   HDL 61 11/17/2016 1539   CHOLHDL 3.2 11/17/2016 1539   LDLCALC 88 11/17/2016 1539    Physical Exam:    VS:  BP 126/72 (BP Location: Right Arm, Patient Position: Sitting, Cuff Size: Normal)   Pulse 71   Ht 5\' 1"  (1.549 m)   Wt 144 lb (65.3 kg)   SpO2 97%   BMI 27.21 kg/m     Wt Readings from Last 3 Encounters:  07/02/17 144 lb (65.3 kg)  03/29/17 144 lb (65.3 kg)  02/24/17 146 lb 1.9 oz (66.3 kg)     GEN:  Well nourished, well developed in no acute distress HEENT: Normal NECK: No JVD; No carotid bruits LYMPHATICS: No lymphadenopathy CARDIAC: RRR, no murmurs, no rubs, no gallops RESPIRATORY:  Clear to auscultation without rales, wheezing or rhonchi  ABDOMEN: Soft, non-tender, non-distended MUSCULOSKELETAL:  No edema; No deformity  SKIN: Warm and dry LOWER EXTREMITIES: no swelling NEUROLOGIC:  Alert and oriented x 3 PSYCHIATRIC:  Normal affect   ASSESSMENT:    1. Coronary artery disease involving native coronary artery of native heart without angina pectoris   2. Essential hypertension   3. Mixed hyperlipidemia    PLAN:    In order of problems listed above:  1. Coronary artery disease: Doing well from that point review continue present management. 2. Essential hypertension blood pressure well controlled. 3. Mixed dyslipidemia: Appropriate medications which I will continue   Medication Adjustments/Labs and Tests Ordered: Current medicines are reviewed at length with the patient today.  Concerns regarding medicines are outlined above.  No orders of the defined types were placed in this encounter.  Medication changes: No orders of the defined  types were placed in this encounter.   Signed, Park Liter, MD, [facility] 07/02/2017 4:57 PM    [place]

## 2017-07-02 NOTE — Patient Instructions (Addendum)
Medication Instructions:  Your physician recommends that you continue on your current medications as directed. Please refer to the Current Medication list given to you today.  Labwork: None  Testing/Procedures: None  Follow-Up: Your physician wants you to follow-up in: 5 months. You will receive a reminder letter in the mail two months in advance. If you don't receive a letter, please call our office to schedule the follow-up appointment.  Any Other Special Instructions Will Be Listed Below (If Applicable).     If you need a refill on your cardiac medications before your next appointment, please call your pharmacy.   

## 2017-08-02 ENCOUNTER — Emergency Department (HOSPITAL_BASED_OUTPATIENT_CLINIC_OR_DEPARTMENT_OTHER)
Admission: EM | Admit: 2017-08-02 | Discharge: 2017-08-02 | Disposition: A | Discharge status: Home / Self Care | Payer: Medicare Other | Attending: Emergency Medicine | Admitting: Emergency Medicine

## 2017-08-02 ENCOUNTER — Other Ambulatory Visit: Payer: Self-pay

## 2017-08-02 ENCOUNTER — Emergency Department (HOSPITAL_BASED_OUTPATIENT_CLINIC_OR_DEPARTMENT_OTHER): Payer: Medicare Other

## 2017-08-02 ENCOUNTER — Encounter (HOSPITAL_BASED_OUTPATIENT_CLINIC_OR_DEPARTMENT_OTHER): Payer: Self-pay | Admitting: *Deleted

## 2017-08-02 DIAGNOSIS — Z7982 Long term (current) use of aspirin: Secondary | ICD-10-CM | POA: Diagnosis not present

## 2017-08-02 DIAGNOSIS — Y999 Unspecified external cause status: Secondary | ICD-10-CM | POA: Diagnosis not present

## 2017-08-02 DIAGNOSIS — S20219A Contusion of unspecified front wall of thorax, initial encounter: Secondary | ICD-10-CM

## 2017-08-02 DIAGNOSIS — S20309A Unspecified superficial injuries of unspecified front wall of thorax, initial encounter: Secondary | ICD-10-CM | POA: Diagnosis present

## 2017-08-02 DIAGNOSIS — I2510 Atherosclerotic heart disease of native coronary artery without angina pectoris: Secondary | ICD-10-CM | POA: Diagnosis not present

## 2017-08-02 DIAGNOSIS — I119 Hypertensive heart disease without heart failure: Secondary | ICD-10-CM | POA: Diagnosis not present

## 2017-08-02 DIAGNOSIS — Z79899 Other long term (current) drug therapy: Secondary | ICD-10-CM | POA: Insufficient documentation

## 2017-08-02 DIAGNOSIS — Y929 Unspecified place or not applicable: Secondary | ICD-10-CM | POA: Diagnosis not present

## 2017-08-02 DIAGNOSIS — Y939 Activity, unspecified: Secondary | ICD-10-CM | POA: Insufficient documentation
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
## 2017-08-02 NOTE — ED Notes (Signed)
Patient transported to X-ray 

## 2017-09-09 ENCOUNTER — Telehealth: Payer: Self-pay | Admitting: Cardiology

## 2017-09-09 MED ORDER — AMLODIPINE BESYLATE 2.5 MG PO TABS: 2.5000 mg | ORAL_TABLET | Freq: Every day | ORAL | 2 refills | Status: DC

## 2017-09-09 NOTE — Telephone Encounter (Signed)
° ° °  1. Which medications need to be refilled? (please list name of each medication and dose if known) amlodipine 2.5mg   2. Which pharmacy/location (including street and city if local pharmacy) is medication to be sent to?[facility] at [address]  3. Do they need a 30 day or 90 day supply? 90 day supply

## 2017-09-09 NOTE — Telephone Encounter (Signed)
Refill for amlodipine sent to [facility] in [facility].

## 2017-09-29 ENCOUNTER — Other Ambulatory Visit: Payer: Self-pay | Admitting: Cardiology

## 2017-12-14 ENCOUNTER — Encounter: Payer: Self-pay | Admitting: Cardiology

## 2017-12-14 ENCOUNTER — Ambulatory Visit: Payer: Medicare Other | Admitting: Cardiology

## 2017-12-14 VITALS — BP 110/60 | HR 62 | Ht 61.0 in | Wt 143.4 lb

## 2017-12-14 DIAGNOSIS — I10 Essential (primary) hypertension: Secondary | ICD-10-CM

## 2017-12-14 DIAGNOSIS — E782 Mixed hyperlipidemia: Secondary | ICD-10-CM | POA: Diagnosis not present

## 2017-12-14 DIAGNOSIS — I2510 Atherosclerotic heart disease of native coronary artery without angina pectoris: Secondary | ICD-10-CM

## 2017-12-14 NOTE — Patient Instructions (Signed)
Medication Instructions:  Your physician recommends that you continue on your current medications as directed. Please refer to the Current Medication list given to you today.  If you need a refill on your cardiac medications before your next appointment, please call your pharmacy.    Lab work: Your physician recommends that you return for lab work today: LFT, Lipids  If you have labs (blood work) drawn today and your tests are completely normal, you will receive your results only by: Marland Kitchen MyChart Message (if you have MyChart) OR . A paper copy in the mail If you have any lab test that is abnormal or we need to change your treatment, we will call you to review the results.  Testing/Procedures: Your physician has requested that you have an echocardiogram. Echocardiography is a painless test that uses sound waves to create images of your heart. It provides your doctor with information about the size and shape of your heart and how well your heart's chambers and valves are working. This procedure takes approximately one hour. There are no restrictions for this procedure.   Follow-Up: At [facility], you and your health needs are our priority.  As part of our continuing mission to provide you with exceptional heart care, we have created designated Provider Care Teams.  These Care Teams include your primary Cardiologist (physician) and Advanced Practice Providers (APPs -  Physician Assistants and Nurse Practitioners) who all work together to provide you with the care you need, when you need it. You will need a follow up appointment in 5 months.  Please call our office 2 months in advance to schedule this appointment.  You may see No primary care provider on file. or another member of our [employer] Provider Team in [place]: Shirlee More, MD . Jyl Heinz, MD  Any Other Special Instructions Will Be Listed Below (If Applicable).  Echocardiogram An echocardiogram, or echocardiography, uses  sound waves (ultrasound) to produce an image of your heart. The echocardiogram is simple, painless, obtained within a short period of time, and offers valuable information to your health care provider. The images from an echocardiogram can provide information such as:  Evidence of coronary artery disease (CAD).  Heart size.  Heart muscle function.  Heart valve function.  Aneurysm detection.  Evidence of a past heart attack.  Fluid buildup around the heart.  Heart muscle thickening.  Assess heart valve function.  Tell a health care provider about:  Any allergies you have.  All medicines you are taking, including vitamins, herbs, eye drops, creams, and over-the-counter medicines.  Any problems you or family members have had with anesthetic medicines.  Any blood disorders you have.  Any surgeries you have had.  Any medical conditions you have.  Whether you are pregnant or may be pregnant. What happens before the procedure? No special preparation is needed. Eat and drink normally. What happens during the procedure?  In order to produce an image of your heart, gel will be applied to your chest and a wand-like tool (transducer) will be moved over your chest. The gel will help transmit the sound waves from the transducer. The sound waves will harmlessly bounce off your heart to allow the heart images to be captured in real-time motion. These images will then be recorded.  You may need an IV to receive a medicine that improves the quality of the pictures. What happens after the procedure? You may return to your normal schedule including diet, activities, and medicines, unless your health care  provider tells you otherwise. This information is not intended to replace advice given to you by your health care provider. Make sure you discuss any questions you have with your health care provider. Document Released: 01/24/2000 Document Revised: 09/14/2015 Document Reviewed:  10/03/2012 Elsevier Interactive Patient Education  2017 [employer].  .

## 2017-12-14 NOTE — Progress Notes (Signed)
Cardiology Office Note:    Date:  12/14/2017   ID:  Haley Walter, DOB 01-May-1940, MRN [id number]  PCP:  Kristopher Glee., MD  Cardiologist:  Jenne Campus, MD    Referring MD: Kristopher Glee., MD   Chief Complaint  Patient presents with  . Follow-up  Doing well  History of Present Illness:    Haley Walter is a 77 y.o. female with coronary artery disease status post inferior wall microinfarction 20 years ago with residual cardiomyopathy overall doing very well exercise on the regular basis in the matter-of-fact before she came to my office today she spent 30 minutes on the treadmill no chest pain tightness squeezing pressure burning chest.  Overall doing well.  Past Medical History:  Diagnosis Date  . Anxiety   . Arthritis   . Coronary artery disease   . High cholesterol   . Hypertension   . MI (myocardial infarction) [facility])     Past Surgical History:  Procedure Laterality Date  . ANTERIOR (CYSTOCELE) AND POSTERIOR REPAIR (RECTOCELE) WITH XENFORM GRAFT AND SACROSPINOUS FIXATION    . ANTERIOR AND POSTERIOR VAGINAL REPAIR W/ SACROSPINOUS LIGAMENT SUSPENSION    . BREAST CYST EXCISION    . CORONARY ANGIOPLASTY WITH STENT PLACEMENT    . INCONTINENCE SURGERY    . VAGINAL HYSTERECTOMY      Current Medications: Current Meds  Medication Sig  . ALPRAZolam (XANAX) 0.25 MG tablet Take 1 tablet (0.25 mg total) by mouth at bedtime as needed.  Marland Kitchen amLODipine (NORVASC) 2.5 MG tablet Take 1 tablet (2.5 mg total) by mouth daily.  Marland Kitchen aspirin EC 81 MG tablet Take 81 mg by mouth daily.  Marland Kitchen atorvastatin (LIPITOR) 40 MG tablet Take 1 tablet (40 mg total) by mouth daily.  . benzonatate (TESSALON PERLES) 100 MG capsule Take 1 capsule (100 mg total) by mouth 3 (three) times daily as needed for cough.  . Calcium Carb-Cholecalciferol (CALCIUM-VITAMIN D) 500-200 MG-UNIT tablet Take 1 tablet by mouth daily.  . diclofenac sodium (VOLTAREN) 1 % GEL Apply 1 application topically 3  (three) times daily.  . diclofenac-misoprostol (ARTHROTEC 75) 75-0.2 MG per tablet Take 1 tablet by mouth 2 (two) times daily.    . metoprolol succinate (TOPROL-XL) 50 MG 24 hr tablet Take 1 and 1/2 tablets by mouth daily  . nitroGLYCERIN (NITROSTAT) 0.4 MG SL tablet Place 0.4 mg under the tongue every 5 (five) minutes as needed.    . pantoprazole (PROTONIX) 40 MG tablet Take 40 mg by mouth daily.  . RABEprazole (ACIPHEX) 20 MG tablet Take 20 mg by mouth daily.    . ramipril (ALTACE) 10 MG capsule TAKE 1 CAPSULE BY MOUTH  DAILY  . risedronate (ACTONEL) 150 MG tablet Take 150 mg by mouth daily.  . rosuvastatin (CRESTOR) 40 MG tablet Take 1 tablet (40 mg total) by mouth daily.     Allergies:   Penicillins   Social History   Socioeconomic History  . Marital status: Married    Spouse name: Not on file  . Number of children: Not on file  . Years of education: Not on file  . Highest education level: Not on file  Occupational History  . Not on file  Social Needs  . Financial resource strain: Not on file  . Food insecurity:    Worry: Not on file    Inability: Not on file  . Transportation needs:    Medical: Not on file    Non-medical: Not on file  Tobacco Use  .  Smoking status: Never Smoker  . Smokeless tobacco: Never Used  Substance and Sexual Activity  . Alcohol use: Yes  . Drug use: No  . Sexual activity: Not on file  Lifestyle  . Physical activity:    Days per week: Not on file    Minutes per session: Not on file  . Stress: Not on file  Relationships  . Social connections:    Talks on phone: Not on file    Gets together: Not on file    Attends religious service: Not on file    Active member of club or organization: Not on file    Attends meetings of clubs or organizations: Not on file    Relationship status: Not on file  Other Topics Concern  . Not on file  Social History Narrative  . Not on file     Family History: The patient's family history includes Cancer in  her mother; Heart disease in her father and maternal aunt; Stroke in her father. ROS:   Please see the history of present illness.    All 14 point review of systems negative except as described per history of present illness  EKGs/Labs/Other Studies Reviewed:      Recent Labs: No results found for requested labs within last 8760 hours.  Recent Lipid Panel    Component Value Date/Time   CHOL 198 11/17/2016 1539   TRIG 245 (H) 11/17/2016 1539   HDL 61 11/17/2016 1539   CHOLHDL 3.2 11/17/2016 1539   LDLCALC 88 11/17/2016 1539    Physical Exam:    VS:  BP 110/60   Pulse 62   Ht 5\' 1"  (1.549 m)   Wt 143 lb 6.4 oz (65 kg)   SpO2 98%   BMI 27.10 kg/m     Wt Readings from Last 3 Encounters:  12/14/17 143 lb 6.4 oz (65 kg)  08/02/17 144 lb (65.3 kg)  07/02/17 144 lb (65.3 kg)     GEN:  Well nourished, well developed in no acute distress HEENT: Normal NECK: No JVD; No carotid bruits LYMPHATICS: No lymphadenopathy CARDIAC: RRR, no murmurs, no rubs, no gallops RESPIRATORY:  Clear to auscultation without rales, wheezing or rhonchi  ABDOMEN: Soft, non-tender, non-distended MUSCULOSKELETAL:  No edema; No deformity  SKIN: Warm and dry LOWER EXTREMITIES: no swelling NEUROLOGIC:  Alert and oriented x 3 PSYCHIATRIC:  Normal affect   ASSESSMENT:    1. Coronary artery disease involving native coronary artery of native heart without angina pectoris   2. Mixed hyperlipidemia   3. Essential hypertension    PLAN:    In order of problems listed above:  1. Coronary artery disease stable from that point review I will ask her to have echocardiogram to recheck left ventricular ejection fraction.  She does have some exertional shortness of breath time to recheck it. 2. Mixed dyslipidemia we will schedule her to have fasting lipid profile done. 3. Essential hypertension blood pressure appears to be well controlled.   Medication Adjustments/Labs and Tests Ordered: Current medicines  are reviewed at length with the patient today.  Concerns regarding medicines are outlined above.  Orders Placed This Encounter  Procedures  . Hepatic function panel  . Lipid panel  . ECHOCARDIOGRAM COMPLETE   Medication changes: No orders of the defined types were placed in this encounter.   Signed, Park Liter, MD, [facility] 12/14/2017 11:53 AM    [place]

## 2017-12-15 ENCOUNTER — Telehealth: Payer: Self-pay

## 2017-12-15 ENCOUNTER — Other Ambulatory Visit: Payer: Self-pay

## 2017-12-15 LAB — HEPATIC FUNCTION PANEL
ALK PHOS: 85 IU/L (ref 39–117)
ALT: 27 IU/L (ref 0–32)
AST: 29 IU/L (ref 0–40)
Albumin: 4.5 g/dL (ref 3.5–4.8)
BILIRUBIN TOTAL: 0.5 mg/dL (ref 0.0–1.2)
BILIRUBIN, DIRECT: 0.12 mg/dL (ref 0.00–0.40)
Total Protein: 6.7 g/dL (ref 6.0–8.5)

## 2017-12-15 LAB — LIPID PANEL
CHOLESTEROL TOTAL: 192 mg/dL (ref 100–199)
Chol/HDL Ratio: 3.3 ratio (ref 0.0–4.4)
HDL: 59 mg/dL (ref >39)
LDL Calculated: 91 mg/dL (ref 0–99)
Triglycerides: 209 mg/dL — ABNORMAL HIGH (ref 0–149)
VLDL CHOLESTEROL CAL: 42 mg/dL — AB (ref 5–40)

## 2017-12-15 MED ORDER — ATORVASTATIN CALCIUM 80 MG PO TABS: 80.0000 mg | ORAL_TABLET | Freq: Every day | ORAL | 1 refills | Status: DC

## 2017-12-15 MED ORDER — METOPROLOL SUCCINATE ER 50 MG PO TB24: ORAL_TABLET | ORAL | 1 refills | Status: DC

## 2017-12-15 MED ORDER — RAMIPRIL 10 MG PO CAPS
10.0000 mg | ORAL_CAPSULE | Freq: Every day | ORAL | 1 refills | Status: DC
Start: 2017-12-15 — End: 2018-06-02

## 2017-12-15 NOTE — Telephone Encounter (Signed)
Atorvastatin 80 mg sent to mail order. Patient aware of lab results

## 2017-12-22 ENCOUNTER — Ambulatory Visit (HOSPITAL_BASED_OUTPATIENT_CLINIC_OR_DEPARTMENT_OTHER)
Admission: RE | Admit: 2017-12-22 | Discharge: 2017-12-22 | Disposition: A | Discharge status: Home / Self Care | Payer: Medicare Other | Source: Ambulatory Visit | Attending: Cardiology | Admitting: Cardiology

## 2017-12-22 DIAGNOSIS — I2510 Atherosclerotic heart disease of native coronary artery without angina pectoris: Secondary | ICD-10-CM | POA: Diagnosis present

## 2017-12-22 DIAGNOSIS — E782 Mixed hyperlipidemia: Secondary | ICD-10-CM | POA: Diagnosis not present

## 2017-12-22 NOTE — Progress Notes (Signed)
  Echocardiogram 2D Echocardiogram has been performed.  Haley Walter Haley Walter 12/22/2017, 4:14 PM

## 2018-03-09 ENCOUNTER — Other Ambulatory Visit: Payer: Self-pay

## 2018-03-09 ENCOUNTER — Ambulatory Visit: Payer: Medicare Other | Admitting: Cardiology

## 2018-03-09 MED ORDER — PANTOPRAZOLE SODIUM 40 MG PO TBEC: 40.0000 mg | DELAYED_RELEASE_TABLET | Freq: Every day | ORAL | 3 refills | Status: DC

## 2018-04-02 ENCOUNTER — Other Ambulatory Visit: Payer: Self-pay | Admitting: Cardiology

## 2018-06-02 ENCOUNTER — Other Ambulatory Visit: Payer: Self-pay | Admitting: Cardiology

## 2018-06-02 NOTE — Telephone Encounter (Signed)
Rx sent in as requested. 

## 2018-06-14 ENCOUNTER — Other Ambulatory Visit: Payer: Self-pay | Admitting: Cardiology

## 2018-08-16 ENCOUNTER — Other Ambulatory Visit: Payer: Self-pay

## 2018-08-16 ENCOUNTER — Encounter: Payer: Self-pay | Admitting: Cardiology

## 2018-08-16 ENCOUNTER — Ambulatory Visit (INDEPENDENT_AMBULATORY_CARE_PROVIDER_SITE_OTHER): Payer: Medicare Other | Admitting: Cardiology

## 2018-08-16 VITALS — BP 110/64 | HR 63 | Ht 61.0 in | Wt 143.0 lb

## 2018-08-16 DIAGNOSIS — E782 Mixed hyperlipidemia: Secondary | ICD-10-CM | POA: Diagnosis not present

## 2018-08-16 DIAGNOSIS — I2510 Atherosclerotic heart disease of native coronary artery without angina pectoris: Secondary | ICD-10-CM

## 2018-08-16 DIAGNOSIS — I10 Essential (primary) hypertension: Secondary | ICD-10-CM

## 2018-08-16 NOTE — Patient Instructions (Signed)
Medication Instructions:  Your physician recommends that you continue on your current medications as directed. Please refer to the Current Medication list given to you today.  If you need a refill on your cardiac medications before your next appointment, please call your pharmacy.   Lab work: None If you have labs (blood work) drawn today and your tests are completely normal, you will receive your results only by: Marland Kitchen MyChart Message (if you have MyChart) OR . A paper copy in the mail If you have any lab test that is abnormal or we need to change your treatment, we will call you to review the results.  Testing/Procedures: NOne  Follow-Up: At [facility], you and your health needs are our priority.  As part of our continuing mission to provide you with exceptional heart care, we have created designated Provider Care Teams.  These Care Teams include your primary Cardiologist (physician) and Advanced Practice Providers (APPs -  Physician Assistants and Nurse Practitioners) who all work together to provide you with the care you need, when you need it. You will need a follow up appointment in 5 months.  Any Other Special Instructions Will Be Listed Below (If Applicable).

## 2018-08-16 NOTE — Progress Notes (Signed)
Cardiology Office Note:    Date:  08/16/2018   ID:  Haley Walter, DOB October 09, 1940, MRN [id number]  PCP:  Kristopher Glee., MD  Cardiologist:  Jenne Campus, MD    Referring MD: Kristopher Glee., MD   Chief Complaint  Patient presents with  . Follow-up  Doing well  History of Present Illness:    Haley Walter is a 78 y.o. female with past medical history significant for coronary artery disease, status post anterior wall myocardial infarction years ago.  Dyslipidemia, hypertension overall she is doing well still trying to be active exercise on the stationary bike as well as walks.  Denies having any chest pain tightness squeezing pressure been chest complain about gaining weight as well as some exertional shortness of breath.  Past Medical History:  Diagnosis Date  . Anxiety   . Arthritis   . Coronary artery disease   . High cholesterol   . Hypertension   . MI (myocardial infarction) [facility])     Past Surgical History:  Procedure Laterality Date  . ANTERIOR (CYSTOCELE) AND POSTERIOR REPAIR (RECTOCELE) WITH XENFORM GRAFT AND SACROSPINOUS FIXATION    . ANTERIOR AND POSTERIOR VAGINAL REPAIR W/ SACROSPINOUS LIGAMENT SUSPENSION    . BREAST CYST EXCISION    . CORONARY ANGIOPLASTY WITH STENT PLACEMENT    . INCONTINENCE SURGERY    . VAGINAL HYSTERECTOMY      Current Medications: Current Meds  Medication Sig  . ALPRAZolam (XANAX) 0.25 MG tablet Take 1 tablet (0.25 mg total) by mouth at bedtime as needed.  Marland Kitchen amLODipine (NORVASC) 2.5 MG tablet TAKE 1 TABLET(2.5 MG) BY MOUTH DAILY  . aspirin EC 81 MG tablet Take 81 mg by mouth daily.  Marland Kitchen atorvastatin (LIPITOR) 80 MG tablet TAKE 1 TABLET BY MOUTH  DAILY  . benzonatate (TESSALON PERLES) 100 MG capsule Take 1 capsule (100 mg total) by mouth 3 (three) times daily as needed for cough.  . Calcium Carb-Cholecalciferol (CALCIUM-VITAMIN D) 500-200 MG-UNIT tablet Take 1 tablet by mouth daily.  . diclofenac sodium (VOLTAREN) 1 %  GEL Apply 1 application topically 3 (three) times daily.  . diclofenac-misoprostol (ARTHROTEC 75) 75-0.2 MG per tablet Take 1 tablet by mouth 2 (two) times daily.    . metoprolol succinate (TOPROL-XL) 50 MG 24 hr tablet TAKE 1 AND 1/2 TABLETS BY  MOUTH DAILY  . nitroGLYCERIN (NITROSTAT) 0.4 MG SL tablet Place 0.4 mg under the tongue every 5 (five) minutes as needed.    . pantoprazole (PROTONIX) 40 MG tablet Take 1 tablet (40 mg total) by mouth daily.  . RABEprazole (ACIPHEX) 20 MG tablet Take 20 mg by mouth daily.    . ramipril (ALTACE) 10 MG capsule TAKE 1 CAPSULE BY MOUTH  DAILY  . risedronate (ACTONEL) 150 MG tablet Take 150 mg by mouth daily.     Allergies:   Penicillins   Social History   Socioeconomic History  . Marital status: Married    Spouse name: Not on file  . Number of children: Not on file  . Years of education: Not on file  . Highest education level: Not on file  Occupational History  . Not on file  Social Needs  . Financial resource strain: Not on file  . Food insecurity    Worry: Not on file    Inability: Not on file  . Transportation needs    Medical: Not on file    Non-medical: Not on file  Tobacco Use  . Smoking status: Never Smoker  .  Smokeless tobacco: Never Used  Substance and Sexual Activity  . Alcohol use: Yes  . Drug use: No  . Sexual activity: Not on file  Lifestyle  . Physical activity    Days per week: Not on file    Minutes per session: Not on file  . Stress: Not on file  Relationships  . Social Herbalist on phone: Not on file    Gets together: Not on file    Attends religious service: Not on file    Active member of club or organization: Not on file    Attends meetings of clubs or organizations: Not on file    Relationship status: Not on file  Other Topics Concern  . Not on file  Social History Narrative  . Not on file     Family History: The patient's family history includes Cancer in her mother; Heart disease in her  father and maternal aunt; Stroke in her father. ROS:   Please see the history of present illness.    All 14 point review of systems negative except as described per history of present illness  EKGs/Labs/Other Studies Reviewed:      Recent Labs: 12/14/2017: ALT 27  Recent Lipid Panel    Component Value Date/Time   CHOL 192 12/14/2017 1205   TRIG 209 (H) 12/14/2017 1205   HDL 59 12/14/2017 1205   CHOLHDL 3.3 12/14/2017 1205   LDLCALC 91 12/14/2017 1205    Physical Exam:    VS:  BP 110/64   Pulse 63   Ht 5\' 1"  (1.549 m)   Wt 143 lb (64.9 kg)   SpO2 98%   BMI 27.02 kg/m     Wt Readings from Last 3 Encounters:  08/16/18 143 lb (64.9 kg)  12/14/17 143 lb 6.4 oz (65 kg)  08/02/17 144 lb (65.3 kg)     GEN:  Well nourished, well developed in no acute distress HEENT: Normal NECK: No JVD; No carotid bruits LYMPHATICS: No lymphadenopathy CARDIAC: RRR, no murmurs, no rubs, no gallops RESPIRATORY:  Clear to auscultation without rales, wheezing or rhonchi  ABDOMEN: Soft, non-tender, non-distended MUSCULOSKELETAL:  No edema; No deformity  SKIN: Warm and dry LOWER EXTREMITIES: no swelling NEUROLOGIC:  Alert and oriented x 3 PSYCHIATRIC:  Normal affect   ASSESSMENT:    1. Coronary artery disease involving native coronary artery of native heart without angina pectoris   2. Essential hypertension   3. Mixed hyperlipidemia    PLAN:    In order of problems listed above:  1. Coronary disease stable from that point to be on appropriate medication which I will continue. 2. Essential hypertension blood pressure well controlled continue present management 3. Mixed dyslipidemia: Last LDL 72.  High intensity statin at the maximum dose already.  We will continue  Overall I think she is doing well we will continue present management see her back in about 5 months   Medication Adjustments/Labs and Tests Ordered: Current medicines are reviewed at length with the patient today.   Concerns regarding medicines are outlined above.  No orders of the defined types were placed in this encounter.  Medication changes: No orders of the defined types were placed in this encounter.   Signed, Park Liter, MD, [facility] 08/16/2018 10:25 AM    [place]

## 2018-09-04 ENCOUNTER — Other Ambulatory Visit: Payer: Self-pay | Admitting: Cardiology

## 2018-10-12 ENCOUNTER — Other Ambulatory Visit: Payer: Self-pay | Admitting: Cardiology

## 2018-12-11 ENCOUNTER — Other Ambulatory Visit: Payer: Self-pay | Admitting: Cardiology

## 2019-02-06 ENCOUNTER — Other Ambulatory Visit: Payer: Self-pay

## 2019-02-06 ENCOUNTER — Encounter: Payer: Self-pay | Admitting: Cardiology

## 2019-02-06 ENCOUNTER — Ambulatory Visit: Payer: Medicare Other | Admitting: Cardiology

## 2019-02-06 VITALS — BP 124/68 | HR 71 | Ht 61.0 in | Wt 146.8 lb

## 2019-02-06 DIAGNOSIS — I10 Essential (primary) hypertension: Secondary | ICD-10-CM

## 2019-02-06 DIAGNOSIS — E782 Mixed hyperlipidemia: Secondary | ICD-10-CM

## 2019-02-06 DIAGNOSIS — I2510 Atherosclerotic heart disease of native coronary artery without angina pectoris: Secondary | ICD-10-CM | POA: Diagnosis not present

## 2019-02-06 NOTE — Progress Notes (Signed)
Cardiology Office Note:    Date:  02/06/2019   ID:  Ronne Alf, DOB 03/25/1940, MRN [id number]  PCP:  Kristopher Glee., MD  Cardiologist:  Jenne Campus, MD    Referring MD: Kristopher Glee., MD   Chief Complaint  Patient presents with  . Follow-up    5 MO FU   Doing well  History of Present Illness:    Haley Walter is a 78 y.o. female coronary disease status post myocardial infarction many years ago, more than 30, obliquely occluded LAD, hypokinesia involving LAD territory.  Consider Thomas for follow-up overall doing well.  Denies have any chest pain, tightness, pressure, burning in the chest.  There was some recent changes in her medications.  Her Lipitor has been changed to Crestor at that was caused by her muscle aches however he did not make much difference.  Also she was given some extra medication for her triglycerides I do not have documentation of it.  She is scheduled to have her fasting lipid profile done within the next month will wait for results of this.  Overall still trying to exercise on a regular basis.  Does have stationary bike at home.  Trying to do it on the regular basis.  Past Medical History:  Diagnosis Date  . Anxiety   . Arthritis   . Coronary artery disease   . High cholesterol   . Hypertension   . MI (myocardial infarction) [facility])     Past Surgical History:  Procedure Laterality Date  . ANTERIOR (CYSTOCELE) AND POSTERIOR REPAIR (RECTOCELE) WITH XENFORM GRAFT AND SACROSPINOUS FIXATION    . ANTERIOR AND POSTERIOR VAGINAL REPAIR W/ SACROSPINOUS LIGAMENT SUSPENSION    . BREAST CYST EXCISION    . CORONARY ANGIOPLASTY WITH STENT PLACEMENT    . INCONTINENCE SURGERY    . VAGINAL HYSTERECTOMY      Current Medications: Current Meds  Medication Sig  . ALPRAZolam (XANAX) 0.25 MG tablet Take 1 tablet (0.25 mg total) by mouth at bedtime as needed.  Marland Kitchen amLODipine (NORVASC) 2.5 MG tablet TAKE 1 TABLET(2.5 MG) BY MOUTH DAILY  . aspirin EC  81 MG tablet Take 81 mg by mouth daily.  . benzonatate (TESSALON PERLES) 100 MG capsule Take 1 capsule (100 mg total) by mouth 3 (three) times daily as needed for cough.  . Calcium Carb-Cholecalciferol (CALCIUM-VITAMIN D) 500-200 MG-UNIT tablet Take 1 tablet by mouth daily.  . diclofenac sodium (VOLTAREN) 1 % GEL Apply 1 application topically 3 (three) times daily.  . diclofenac-misoprostol (ARTHROTEC 75) 75-0.2 MG per tablet Take 1 tablet by mouth 2 (two) times daily.    . metoprolol succinate (TOPROL-XL) 50 MG 24 hr tablet TAKE 1 AND 1/2 TABLETS BY  MOUTH DAILY  . nitroGLYCERIN (NITROSTAT) 0.4 MG SL tablet Place 0.4 mg under the tongue every 5 (five) minutes as needed.    . pantoprazole (PROTONIX) 40 MG tablet Take 1 tablet (40 mg total) by mouth daily.  . RABEprazole (ACIPHEX) 20 MG tablet Take 20 mg by mouth daily.    . ramipril (ALTACE) 10 MG capsule TAKE 1 CAPSULE BY MOUTH  DAILY  . risedronate (ACTONEL) 150 MG tablet Take 150 mg by mouth daily.  . rosuvastatin (CRESTOR) 10 MG tablet Take 10 mg by mouth daily.     Allergies:   Penicillins   Social History   Socioeconomic History  . Marital status: Married    Spouse name: Not on file  . Number of children: Not on file  .  Years of education: Not on file  . Highest education level: Not on file  Occupational History  . Not on file  Tobacco Use  . Smoking status: Never Smoker  . Smokeless tobacco: Never Used  Substance and Sexual Activity  . Alcohol use: Yes  . Drug use: No  . Sexual activity: Not on file  Other Topics Concern  . Not on file  Social History Narrative  . Not on file   Social Determinants of Health   Financial Resource Strain:   . Difficulty of Paying Living Expenses: Not on file  Food Insecurity:   . Worried About Charity fundraiser in the Last Year: Not on file  . Ran Out of Food in the Last Year: Not on file  Transportation Needs:   . Lack of Transportation (Medical): Not on file  . Lack of  Transportation (Non-Medical): Not on file  Physical Activity:   . Days of Exercise per Week: Not on file  . Minutes of Exercise per Session: Not on file  Stress:   . Feeling of Stress : Not on file  Social Connections:   . Frequency of Communication with Friends and Family: Not on file  . Frequency of Social Gatherings with Friends and Family: Not on file  . Attends Religious Services: Not on file  . Active Member of Clubs or Organizations: Not on file  . Attends Archivist Meetings: Not on file  . Marital Status: Not on file     Family History: The patient's family history includes Cancer in her mother; Heart disease in her father and maternal aunt; Stroke in her father. ROS:   Please see the history of present illness.    All 14 point review of systems negative except as described per history of present illness  EKGs/Labs/Other Studies Reviewed:    EKG done today showed normal sinus rhythm, low voltage QRS, nonspecific T wave changes.  Recent Labs: No results found for requested labs within last 8760 hours.  Recent Lipid Panel    Component Value Date/Time   CHOL 192 12/14/2017 1205   TRIG 209 (H) 12/14/2017 1205   HDL 59 12/14/2017 1205   CHOLHDL 3.3 12/14/2017 1205   LDLCALC 91 12/14/2017 1205    Physical Exam:    VS:  BP 124/68   Pulse 71   Ht 5\' 1"  (1.549 m)   Wt 146 lb 12.8 oz (66.6 kg)   SpO2 98%   BMI 27.74 kg/m     Wt Readings from Last 3 Encounters:  02/06/19 146 lb 12.8 oz (66.6 kg)  08/16/18 143 lb (64.9 kg)  12/14/17 143 lb 6.4 oz (65 kg)     GEN:  Well nourished, well developed in no acute distress HEENT: Normal NECK: No JVD; No carotid bruits LYMPHATICS: No lymphadenopathy CARDIAC: RRR, no murmurs, no rubs, no gallops RESPIRATORY:  Clear to auscultation without rales, wheezing or rhonchi  ABDOMEN: Soft, non-tender, non-distended MUSCULOSKELETAL:  No edema; No deformity  SKIN: Warm and dry LOWER EXTREMITIES: no  swelling NEUROLOGIC:  Alert and oriented x 3 PSYCHIATRIC:  Normal affect   ASSESSMENT:    1. Coronary artery disease involving native coronary artery of native heart without angina pectoris   2. Essential hypertension   3. Mixed hyperlipidemia    PLAN:    In order of problems listed above:  1. Coronary disease stable from that point review of her echocardiogram to reassess left ventricle ejection fraction.  She declined.  She wants  to do next time 2. Essential hypertension blood pressure well controlled continue present management. 3. Mixed dyslipidemia.  She is scheduled to see her fasting lipid profile done by her primary care physician   Medication Adjustments/Labs and Tests Ordered: Current medicines are reviewed at length with the patient today.  Concerns regarding medicines are outlined above.  No orders of the defined types were placed in this encounter.  Medication changes: No orders of the defined types were placed in this encounter.   Signed, Park Liter, MD, [facility] 02/06/2019 11:11 AM    [place]

## 2019-02-06 NOTE — Patient Instructions (Signed)
Medication Instructions:  Your physician recommends that you continue on your current medications as directed. Please refer to the Current Medication list given to you today.  *If you need a refill on your cardiac medications before your next appointment, please call your pharmacy*  Lab Work: NONE TODAY If you have labs (blood work) drawn today and your tests are completely normal, you will receive your results only by: Marland Kitchen MyChart Message (if you have MyChart) OR . A paper copy in the mail If you have any lab test that is abnormal or we need to change your treatment, we will call you to review the results.  Testing/Procedures: NONE TODAY  Follow-Up: At [facility], you and your health needs are our priority.  As part of our continuing mission to provide you with exceptional heart care, we have created designated Provider Care Teams.  These Care Teams include your primary Cardiologist (physician) and Advanced Practice Providers (APPs -  Physician Assistants and Nurse Practitioners) who all work together to provide you with the care you need, when you need it.  Your next appointment:   5 month(s)  The format for your next appointment:   In Person  Provider:   Jenne Campus, MD  Other Instructions NONE TODAY

## 2019-02-17 IMAGING — CR DG STERNUM 2+V
1 series · 1 of 1 positions shown · non-contrast
Comparison: 03/05/2016.

CLINICAL DATA: Pain.  History of sternal fracture.

EXAM:
STERNUM - 2+ VIEW

[w sternum lat]
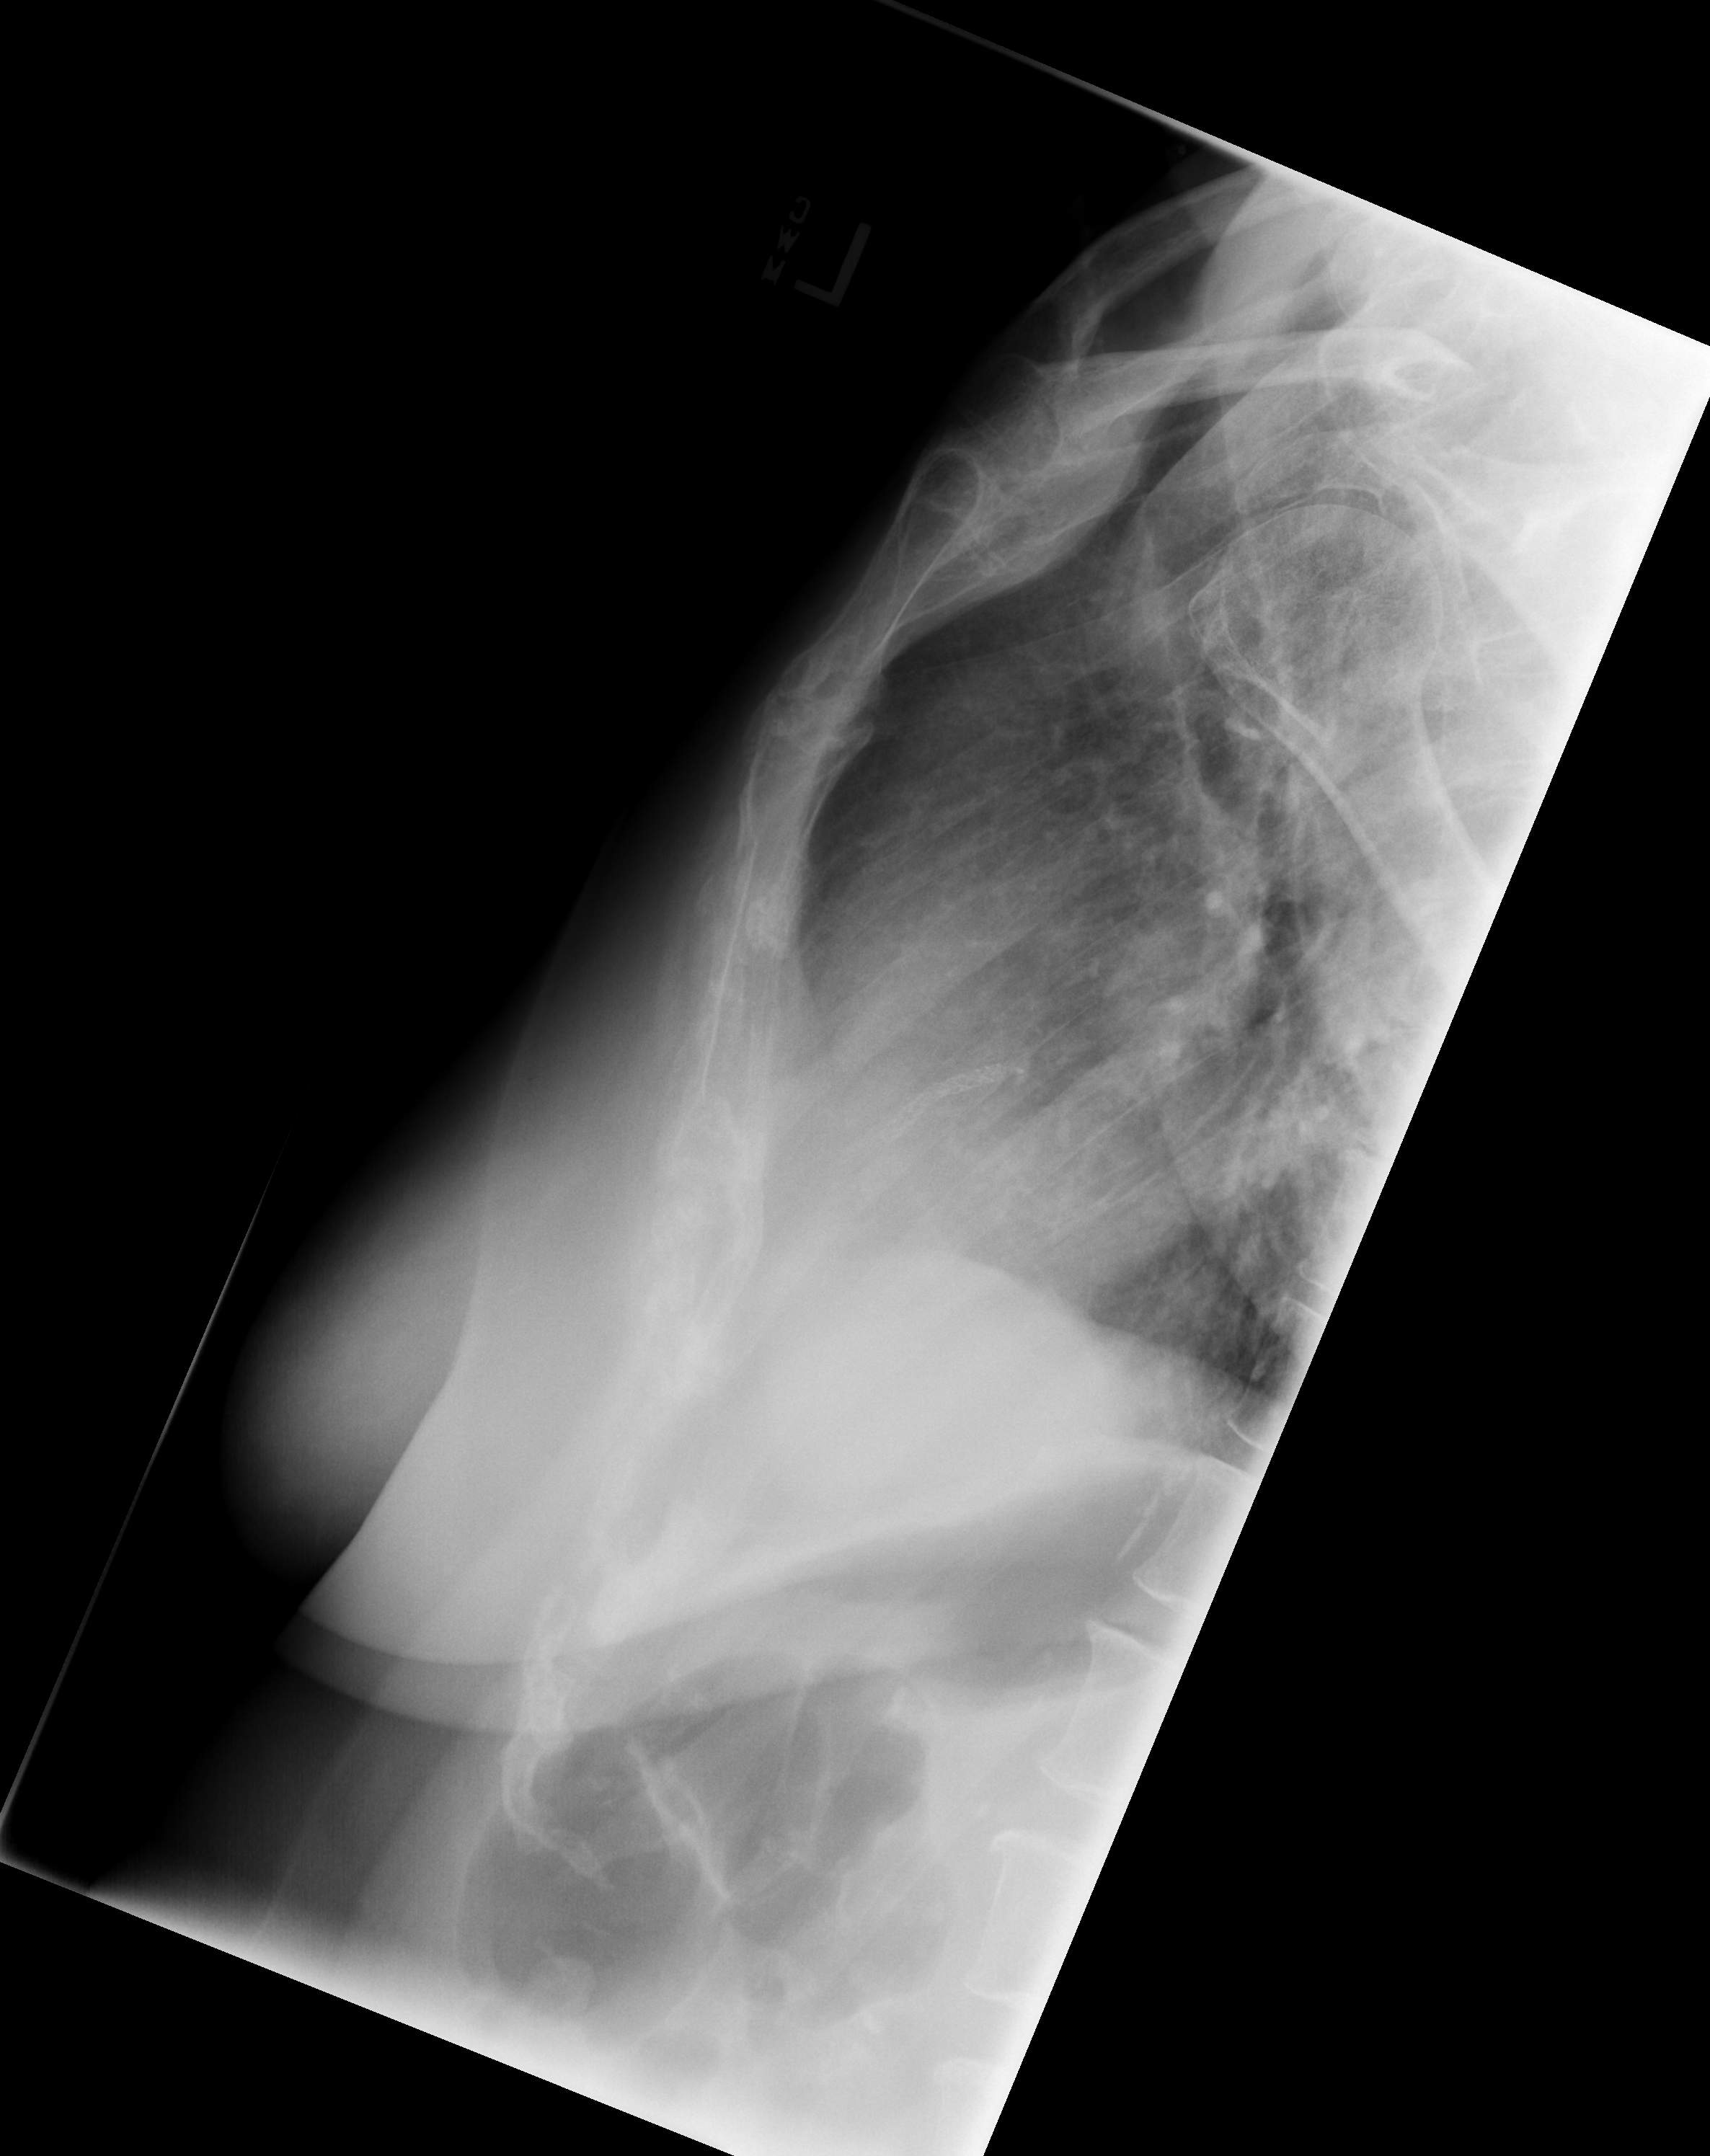

[1 of 1 positions shown; findings below may reference images not displayed]

FINDINGS: There is no definite acute sternal fracture. There is an oblique
deformity of the sternum, slight overriding, with osseous spurring
and bony bridging suggesting a chronic fracture.
IMPRESSION: No definite acute sternal fracture.
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
## 2019-03-03 ENCOUNTER — Ambulatory Visit: Payer: Medicare Other | Attending: Internal Medicine

## 2019-03-03 DIAGNOSIS — Z23 Encounter for immunization: Secondary | ICD-10-CM

## 2019-03-03 NOTE — Progress Notes (Signed)
   Covid-19 Vaccination Clinic  Name:  Trenice Ahsan    MRN: [id number] DOB: 1940-06-18  03/03/2019  Ms. Azpeitia was observed post Covid-19 immunization for 15 minutes without incidence. She was provided with Vaccine Information Sheet and instruction to access the V-Safe system.   Ms. Furse was instructed to call 911 with any severe reactions post vaccine: Marland Kitchen Difficulty breathing  . Swelling of your face and throat  . A fast heartbeat  . A bad rash all over your body  . Dizziness and weakness    Immunizations Administered    Name Date Dose VIS Date Route   Pfizer COVID-19 Vaccine 03/03/2019  6:16 PM 0.3 mL 01/20/2019 Intramuscular   Manufacturer: [place]   Lot: [id number]   [place]: [id number]

## 2019-03-23 ENCOUNTER — Other Ambulatory Visit: Payer: Self-pay | Admitting: Cardiology

## 2019-03-24 ENCOUNTER — Ambulatory Visit: Payer: Medicare Other | Attending: Internal Medicine

## 2019-03-24 DIAGNOSIS — Z23 Encounter for immunization: Secondary | ICD-10-CM | POA: Insufficient documentation

## 2019-03-24 NOTE — Progress Notes (Signed)
   Covid-19 Vaccination Clinic  Name:  Haley Walter    MRN: [id number] DOB: Oct 04, 1940  03/24/2019  [name] was observed post Covid-19 immunization for 15 minutes without incidence. She was provided with Vaccine Information Sheet and instruction to access the V-Safe system.   [name] was instructed to call 911 with any severe reactions post vaccine: Marland Kitchen Difficulty breathing  . Swelling of your face and throat  . A fast heartbeat  . A bad rash all over your body  . Dizziness and weakness    Immunizations Administered    Name Date Dose VIS Date Route   Pfizer COVID-19 Vaccine 03/24/2019  5:29 PM 0.3 mL 01/20/2019 Intramuscular   Manufacturer: [place]   Lot: [id number]   [place]: [id number]

## 2019-04-08 ENCOUNTER — Other Ambulatory Visit: Payer: Self-pay | Admitting: Cardiology

## 2019-06-02 ENCOUNTER — Encounter: Payer: Self-pay | Admitting: Cardiology

## 2019-06-02 ENCOUNTER — Ambulatory Visit: Payer: Medicare Other | Admitting: Cardiology

## 2019-06-02 ENCOUNTER — Other Ambulatory Visit: Payer: Self-pay

## 2019-06-02 VITALS — BP 122/68 | HR 74 | Ht 61.0 in | Wt 146.0 lb

## 2019-06-02 DIAGNOSIS — I10 Essential (primary) hypertension: Secondary | ICD-10-CM

## 2019-06-02 DIAGNOSIS — E782 Mixed hyperlipidemia: Secondary | ICD-10-CM

## 2019-06-02 DIAGNOSIS — I2510 Atherosclerotic heart disease of native coronary artery without angina pectoris: Secondary | ICD-10-CM | POA: Diagnosis not present

## 2019-06-02 DIAGNOSIS — K219 Gastro-esophageal reflux disease without esophagitis: Secondary | ICD-10-CM

## 2019-06-02 MED ORDER — NITROGLYCERIN 0.4 MG SL SUBL: 0.4000 mg | SUBLINGUAL_TABLET | SUBLINGUAL | 3 refills | Status: DC | PRN

## 2019-06-02 NOTE — Patient Instructions (Signed)
Medication Instructions:  Your physician recommends that you continue on your current medications as directed. Please refer to the Current Medication list given to you today. 1. Nitro was sent in to pharmacy today.   If a single episode of chest pain is not relieved by one tablet, the patient will try another within 5 minutes; and if this doesn't relieve the pain, the patient will try another within 5 minutes and if this doesn't relieve the pain the patient is instructed to call 911 for transportation to an emergency department.   *If you need a refill on your cardiac medications before your next appointment, please call your pharmacy*   Lab Work: -None  If you have labs (blood work) drawn today and your tests are completely normal, you will receive your results only by: Marland Kitchen MyChart Message (if you have MyChart) OR . A paper copy in the mail If you have any lab test that is abnormal or we need to change your treatment, we will call you to review the results.   Testing/Procedures: -None    Follow-Up: At [facility], you and your health needs are our priority.  As part of our continuing mission to provide you with exceptional heart care, we have created designated Provider Care Teams.  These Care Teams include your primary Cardiologist (physician) and Advanced Practice Providers (APPs -  Physician Assistants and Nurse Practitioners) who all work together to provide you with the care you need, when you need it.  We recommend signing up for the patient portal called "MyChart".  Sign up information is provided on this After Visit Summary.  MyChart is used to connect with patients for Virtual Visits (Telemedicine).  Patients are able to view lab/test results, encounter notes, upcoming appointments, etc.  Non-urgent messages can be sent to your provider as well.   To learn more about what you can do with MyChart, go to [email or web address].    Your next appointment:   6 month(s)  The  format for your next appointment:   In Person  Provider:   Jenne Campus, MD   Other Instructions Your physician wants you to follow-up in: 6 month with Dr. Fraser Din.  You will receive a reminder letter in the mail two months in advance. If you don't receive a letter, please call our office to schedule the follow-up appointment.

## 2019-06-02 NOTE — Progress Notes (Signed)
Cardiology Office Note:    Date:  06/02/2019   ID:  Haley Walter, DOB 31-Aug-1940, MRN [id number]  PCP:  Kristopher Glee., MD  Cardiologist:  Jenne Campus, MD    Referring MD: Kristopher Glee., MD   Chief Complaint  Patient presents with  . Follow-up  Doing very well  History of Present Illness:    Haley Walter is a 79 y.o. female   coronary disease status post myocardial infarction many years ago, more than 31, obliquely occluded LAD, hypokinesia involving LAD territory.  Consider [facility] for follow-up overall doing well.  Denies have any chest pain, tightness, pressure, burning in the chest.  There was some recent changes in her medications.  Her Lipitor has been changed to Crestor at that was caused by her muscle aches however he did not make much difference.  Also she was given some extra medication for her triglycerides I do not have documentation of it.  She is scheduled to have her fasting lipid profile done within the next month will wait for results of this.  Overall still trying to exercise on a regular basis.  Does have stationary bike at home Overall doing very well.  Denies any chest pain tightness squeezing pressure burning chest still time to be active.  No complaints  Past Medical History:  Diagnosis Date  . Anxiety   . Arthritis   . Coronary artery disease   . High cholesterol   . Hypertension   . MI (myocardial infarction) [facility])     Past Surgical History:  Procedure Laterality Date  . ANTERIOR (CYSTOCELE) AND POSTERIOR REPAIR (RECTOCELE) WITH XENFORM GRAFT AND SACROSPINOUS FIXATION    . ANTERIOR AND POSTERIOR VAGINAL REPAIR W/ SACROSPINOUS LIGAMENT SUSPENSION    . BREAST CYST EXCISION    . CORONARY ANGIOPLASTY WITH STENT PLACEMENT    . INCONTINENCE SURGERY    . VAGINAL HYSTERECTOMY      Current Medications: Current Meds  Medication Sig  . ALPRAZolam (XANAX) 0.25 MG tablet Take 1 tablet (0.25 mg total) by mouth at bedtime as needed.  Marland Kitchen  amLODipine (NORVASC) 2.5 MG tablet TAKE 1 TABLET(2.5 MG) BY MOUTH DAILY  . aspirin EC 81 MG tablet Take 81 mg by mouth daily.  . Calcium Carb-Cholecalciferol (CALCIUM-VITAMIN D) 500-200 MG-UNIT tablet Take 1 tablet by mouth daily.  . calcium-vitamin D (OSCAL WITH D) 500-200 MG-UNIT TABS tablet Take by mouth.  . diclofenac sodium (VOLTAREN) 1 % GEL Apply 1 application topically 3 (three) times daily.  . diclofenac-misoprostol (ARTHROTEC 75) 75-0.2 MG per tablet Take 1 tablet by mouth 2 (two) times daily.    . ferrous sulfate 325 (65 FE) MG tablet Take by mouth.  . gabapentin (NEURONTIN) 100 MG capsule 2 capsules at bedtime  . metoprolol succinate (TOPROL-XL) 50 MG 24 hr tablet TAKE 1 AND 1/2 TABLETS BY  MOUTH DAILY  . nitroGLYCERIN (NITROSTAT) 0.4 MG SL tablet Place 0.4 mg under the tongue every 5 (five) minutes as needed.    . pantoprazole (PROTONIX) 40 MG tablet Take 1 tablet (40 mg total) by mouth daily.  . RABEprazole (ACIPHEX) 20 MG tablet Take 20 mg by mouth daily.    . ramipril (ALTACE) 10 MG capsule TAKE 1 CAPSULE BY MOUTH  DAILY  . risedronate (ACTONEL) 150 MG tablet Take 150 mg by mouth daily.  . rosuvastatin (CRESTOR) 10 MG tablet Take 10 mg by mouth daily.     Allergies:   Penicillins   Social History   Socioeconomic History  .  Marital status: Married    Spouse name: Not on file  . Number of children: Not on file  . Years of education: Not on file  . Highest education level: Not on file  Occupational History  . Not on file  Tobacco Use  . Smoking status: Never Smoker  . Smokeless tobacco: Never Used  Substance and Sexual Activity  . Alcohol use: Yes  . Drug use: No  . Sexual activity: Not on file  Other Topics Concern  . Not on file  Social History Narrative  . Not on file   Social Determinants of Health   Financial Resource Strain:   . Difficulty of Paying Living Expenses:   Food Insecurity:   . Worried About Charity fundraiser in the Last Year:   . Youth worker in the Last Year:   Transportation Needs:   . Film/video editor (Medical):   Marland Kitchen Lack of Transportation (Non-Medical):   Physical Activity:   . Days of Exercise per Week:   . Minutes of Exercise per Session:   Stress:   . Feeling of Stress :   Social Connections:   . Frequency of Communication with Friends and Family:   . Frequency of Social Gatherings with Friends and Family:   . Attends Religious Services:   . Active Member of Clubs or Organizations:   . Attends Archivist Meetings:   Marland Kitchen Marital Status:      Family History: The patient's family history includes Cancer in her mother; Heart disease in her father and maternal aunt; Stroke in her father. ROS:   Please see the history of present illness.    All 14 point review of systems negative except as described per history of present illness  EKGs/Labs/Other Studies Reviewed:      Recent Labs: No results found for requested labs within last 8760 hours.  Recent Lipid Panel    Component Value Date/Time   CHOL 192 12/14/2017 1205   TRIG 209 (H) 12/14/2017 1205   HDL 59 12/14/2017 1205   CHOLHDL 3.3 12/14/2017 1205   LDLCALC 91 12/14/2017 1205    Physical Exam:    VS:  BP 122/68   Pulse 74   Ht 5\' 1"  (1.549 m)   Wt 146 lb (66.2 kg)   SpO2 97%   BMI 27.59 kg/m     Wt Readings from Last 3 Encounters:  06/02/19 146 lb (66.2 kg)  02/06/19 146 lb 12.8 oz (66.6 kg)  08/16/18 143 lb (64.9 kg)     GEN:  Well nourished, well developed in no acute distress HEENT: Normal NECK: No JVD; No carotid bruits LYMPHATICS: No lymphadenopathy CARDIAC: RRR, no murmurs, no rubs, no gallops RESPIRATORY:  Clear to auscultation without rales, wheezing or rhonchi  ABDOMEN: Soft, non-tender, non-distended MUSCULOSKELETAL:  No edema; No deformity  SKIN: Warm and dry LOWER EXTREMITIES: no swelling NEUROLOGIC:  Alert and oriented x 3 PSYCHIATRIC:  Normal affect   ASSESSMENT:    1. Coronary artery disease  involving native coronary artery of native heart without angina pectoris   2. Essential hypertension   3. Gastroesophageal reflux disease without esophagitis   4. Mixed hyperlipidemia    PLAN:    In order of problems listed above:  1. Coronary disease status post cardiac catheterization showed completely occluded proximal LAD, LAD asymptomatic.  We will continue present management.  After her echocardiogram to assess left ventricle ejection fraction she declined. 2. Essential hypertension blood pressure well controlled  we will continue present management. 3. Gastroesophageal reflux disease stable. 4. Mixed dyslipidemia.  Will call primary care physician to get her fasting lipid profile.  I do have fasting lipid profile from year ago where LDL was 91 HDL 59.  Ideally LDL should be closer to 70.  However she did have some difficulty tolerating high dosages of her medications.  There is no problem in terms of recent coronary event for at least 10 years.  Overall she is doing well I will see her back 6 months   Medication Adjustments/Labs and Tests Ordered: Current medicines are reviewed at length with the patient today.  Concerns regarding medicines are outlined above.  No orders of the defined types were placed in this encounter.  Medication changes:  Meds ordered this encounter  Medications  . nitroGLYCERIN (NITROSTAT) 0.4 MG SL tablet    Sig: Place 1 tablet (0.4 mg total) under the tongue every 5 (five) minutes as needed for chest pain.    Dispense:  25 tablet    Refill:  3    Signed, Park Liter, MD, [facility] 06/02/2019 2:49 PM    [place]

## 2019-06-09 ENCOUNTER — Other Ambulatory Visit: Payer: Self-pay | Admitting: Cardiology

## 2019-07-04 DIAGNOSIS — M5416 Radiculopathy, lumbar region: Secondary | ICD-10-CM

## 2019-07-04 DIAGNOSIS — D509 Iron deficiency anemia, unspecified: Secondary | ICD-10-CM | POA: Insufficient documentation

## 2019-07-04 HISTORY — DX: Iron deficiency anemia, unspecified: D50.9

## 2019-07-04 HISTORY — DX: Radiculopathy, lumbar region: M54.16

## 2019-09-27 DIAGNOSIS — H43813 Vitreous degeneration, bilateral: Secondary | ICD-10-CM

## 2019-09-27 DIAGNOSIS — H43393 Other vitreous opacities, bilateral: Secondary | ICD-10-CM | POA: Insufficient documentation

## 2019-09-27 DIAGNOSIS — H35033 Hypertensive retinopathy, bilateral: Secondary | ICD-10-CM

## 2019-09-27 DIAGNOSIS — H2513 Age-related nuclear cataract, bilateral: Secondary | ICD-10-CM

## 2019-09-27 HISTORY — DX: Age-related nuclear cataract, bilateral: H25.13

## 2019-09-27 HISTORY — DX: Other vitreous opacities, bilateral: H43.393

## 2019-09-27 HISTORY — DX: Vitreous degeneration, bilateral: H43.813

## 2019-09-27 HISTORY — DX: Hypertensive retinopathy, bilateral: H35.033

## 2019-10-12 DIAGNOSIS — M65341 Trigger finger, right ring finger: Secondary | ICD-10-CM | POA: Insufficient documentation

## 2019-10-12 HISTORY — DX: Trigger finger, right ring finger: M65.341

## 2019-11-30 DIAGNOSIS — I10 Essential (primary) hypertension: Secondary | ICD-10-CM | POA: Insufficient documentation

## 2019-11-30 DIAGNOSIS — I219 Acute myocardial infarction, unspecified: Secondary | ICD-10-CM | POA: Insufficient documentation

## 2019-11-30 DIAGNOSIS — M1990 Unspecified osteoarthritis, unspecified site: Secondary | ICD-10-CM | POA: Insufficient documentation

## 2019-11-30 DIAGNOSIS — E7800 Pure hypercholesterolemia, unspecified: Secondary | ICD-10-CM | POA: Insufficient documentation

## 2019-11-30 DIAGNOSIS — F419 Anxiety disorder, unspecified: Secondary | ICD-10-CM | POA: Insufficient documentation

## 2019-11-30 DIAGNOSIS — I2510 Atherosclerotic heart disease of native coronary artery without angina pectoris: Secondary | ICD-10-CM | POA: Insufficient documentation

## 2019-12-04 ENCOUNTER — Ambulatory Visit: Payer: Medicare Other | Admitting: Cardiology

## 2019-12-04 ENCOUNTER — Other Ambulatory Visit: Payer: Self-pay

## 2019-12-04 ENCOUNTER — Encounter: Payer: Self-pay | Admitting: Cardiology

## 2019-12-04 VITALS — BP 128/70 | HR 68 | Ht 61.0 in | Wt 145.0 lb

## 2019-12-04 DIAGNOSIS — I2510 Atherosclerotic heart disease of native coronary artery without angina pectoris: Secondary | ICD-10-CM

## 2019-12-04 DIAGNOSIS — E782 Mixed hyperlipidemia: Secondary | ICD-10-CM | POA: Diagnosis not present

## 2019-12-04 DIAGNOSIS — I10 Essential (primary) hypertension: Secondary | ICD-10-CM

## 2019-12-04 NOTE — Progress Notes (Signed)
Cardiology Office Note:    Date:  12/04/2019   ID:  Haley Walter, DOB Aug 13, 1940, MRN [id number]  PCP:  Kristopher Glee., MD  Cardiologist:  Jenne Campus, MD    Referring MD: Kristopher Glee., MD   No chief complaint on file. I am doing very well  History of Present Illness:    Haley Walter is a 79 y.o. female with past medical history significant for coronary artery disease, status post myocardial infarction involving LAD many years ago.  She does have completely occluded left anterior descending artery.  However ejection fraction is still decent.  She comes today 2 months of follow-up.  Overall doing well she still exercise on the regular basis 5 times a week she does yoga she does some aerobic exercises have no difficulty doing it.  Overall she is very cheerful and energetic like always.  Past Medical History:  Diagnosis Date  . Anxiety   . Arthritis   . Coronary artery disease   . Coronary artery disease involving native coronary artery of native heart without angina pectoris 11/28/2014   Overview:  Completely occluded left anterior descending artery  . Essential hypertension 02/24/2017  . Gastroesophageal reflux disease without esophagitis 01/07/2016  . High cholesterol   . Hypertension   . MI (myocardial infarction) ([place])   . Mixed hyperlipidemia 11/28/2014  . Primary insomnia 07/01/2015  . Rectocele 11/29/2014    Past Surgical History:  Procedure Laterality Date  . ANTERIOR (CYSTOCELE) AND POSTERIOR REPAIR (RECTOCELE) WITH XENFORM GRAFT AND SACROSPINOUS FIXATION    . ANTERIOR AND POSTERIOR VAGINAL REPAIR W/ SACROSPINOUS LIGAMENT SUSPENSION    . BREAST CYST EXCISION    . CORONARY ANGIOPLASTY WITH STENT PLACEMENT    . INCONTINENCE SURGERY    . VAGINAL HYSTERECTOMY      Current Medications: Current Meds  Medication Sig  . ALPRAZolam (XANAX) 0.25 MG tablet Take 1 tablet (0.25 mg total) by mouth at bedtime as needed.  Marland Kitchen amLODipine (NORVASC) 2.5 MG  tablet TAKE 1 TABLET(2.5 MG) BY MOUTH DAILY  . aspirin EC 81 MG tablet Take 81 mg by mouth daily.  . Calcium Carb-Cholecalciferol (CALCIUM-VITAMIN D) 500-200 MG-UNIT tablet Take 1 tablet by mouth daily.  . diclofenac sodium (VOLTAREN) 1 % GEL Apply 1 application topically 3 (three) times daily.  . diclofenac-misoprostol (ARTHROTEC 75) 75-0.2 MG per tablet Take 1 tablet by mouth 2 (two) times daily.    Marland Kitchen gabapentin (NEURONTIN) 100 MG capsule 2 capsules at bedtime  . metoprolol succinate (TOPROL-XL) 50 MG 24 hr tablet TAKE 1 AND 1/2 TABLETS BY  MOUTH DAILY  . nitroGLYCERIN (NITROSTAT) 0.4 MG SL tablet Place 0.4 mg under the tongue every 5 (five) minutes as needed.    . pantoprazole (PROTONIX) 40 MG tablet Take 1 tablet (40 mg total) by mouth daily.  . RABEprazole (ACIPHEX) 20 MG tablet Take 20 mg by mouth daily.    . ramipril (ALTACE) 10 MG capsule TAKE 1 CAPSULE BY MOUTH  DAILY  . risedronate (ACTONEL) 150 MG tablet Take 150 mg by mouth daily.  . rosuvastatin (CRESTOR) 10 MG tablet Take 10 mg by mouth daily.     Allergies:   Penicillins   Social History   Socioeconomic History  . Marital status: Married    Spouse name: Not on file  . Number of children: Not on file  . Years of education: Not on file  . Highest education level: Not on file  Occupational History  . Not on file  Tobacco  Use  . Smoking status: Never Smoker  . Smokeless tobacco: Never Used  Vaping Use  . Vaping Use: Never used  Substance and Sexual Activity  . Alcohol use: Yes  . Drug use: No  . Sexual activity: Not on file  Other Topics Concern  . Not on file  Social History Narrative  . Not on file   Social Determinants of Health   Financial Resource Strain:   . Difficulty of Paying Living Expenses: Not on file  Food Insecurity:   . Worried About Charity fundraiser in the Last Year: Not on file  . Ran Out of Food in the Last Year: Not on file  Transportation Needs:   . Lack of Transportation (Medical):  Not on file  . Lack of Transportation (Non-Medical): Not on file  Physical Activity:   . Days of Exercise per Week: Not on file  . Minutes of Exercise per Session: Not on file  Stress:   . Feeling of Stress : Not on file  Social Connections:   . Frequency of Communication with Friends and Family: Not on file  . Frequency of Social Gatherings with Friends and Family: Not on file  . Attends Religious Services: Not on file  . Active Member of Clubs or Organizations: Not on file  . Attends Archivist Meetings: Not on file  . Marital Status: Not on file     Family History: The patient's family history includes Cancer in her mother; Heart disease in her father and maternal aunt; Stroke in her father. ROS:   Please see the history of present illness.    All 14 point review of systems negative except as described per history of present illness  EKGs/Labs/Other Studies Reviewed:      Recent Labs: No results found for requested labs within last 8760 hours.  Recent Lipid Panel    Component Value Date/Time   CHOL 192 12/14/2017 1205   TRIG 209 (H) 12/14/2017 1205   HDL 59 12/14/2017 1205   CHOLHDL 3.3 12/14/2017 1205   LDLCALC 91 12/14/2017 1205    Physical Exam:    VS:  BP 128/70   Pulse 68   Ht 5\' 1"  (1.549 m)   Wt 145 lb (65.8 kg)   SpO2 96%   BMI 27.40 kg/m     Wt Readings from Last 3 Encounters:  12/04/19 145 lb (65.8 kg)  06/02/19 146 lb (66.2 kg)  02/06/19 146 lb 12.8 oz (66.6 kg)     GEN:  Well nourished, well developed in no acute distress HEENT: Normal NECK: No JVD; No carotid bruits LYMPHATICS: No lymphadenopathy CARDIAC: RRR, no murmurs, no rubs, no gallops RESPIRATORY:  Clear to auscultation without rales, wheezing or rhonchi  ABDOMEN: Soft, non-tender, non-distended MUSCULOSKELETAL:  No edema; No deformity  SKIN: Warm and dry LOWER EXTREMITIES: no swelling NEUROLOGIC:  Alert and oriented x 3 PSYCHIATRIC:  Normal affect   ASSESSMENT:     1. Coronary artery disease involving native coronary artery of native heart without angina pectoris   2. Essential hypertension   3. Mixed hyperlipidemia    PLAN:    In order of problems listed above:  1. Coronary disease stable from that point of view.  I will continue present management which include antiplatelets therapy as well as statin. 2. History of cardiomyopathy ischemic I will ask you to have echocardiogram done to recheck left ventricle ejection fraction. 3. Essential hypertension blood pressure well controlled continue present management. 4. Dyslipidemia: I  did review her fasting lipid profile from few months ago LDL 65 HDL 51 this is a good cholesterol profile, will continue present management.   Medication Adjustments/Labs and Tests Ordered: Current medicines are reviewed at length with the patient today.  Concerns regarding medicines are outlined above.  Orders Placed This Encounter  Procedures  . ECHOCARDIOGRAM COMPLETE   Medication changes: No orders of the defined types were placed in this encounter.   Signed, Park Liter, MD, [facility] 12/04/2019 2:07 PM    [place]

## 2019-12-04 NOTE — Patient Instructions (Signed)
Medication Instructions:  °Your physician recommends that you continue on your current medications as directed. Please refer to the Current Medication list given to you today. ° °*If you need a refill on your cardiac medications before your next appointment, please call your pharmacy* ° ° °Lab Work: °None. ° °If you have labs (blood work) drawn today and your tests are completely normal, you will receive your results only by: °• MyChart Message (if you have MyChart) OR °• A paper copy in the mail °If you have any lab test that is abnormal or we need to change your treatment, we will call you to review the results. ° ° °Testing/Procedures: °Your physician has requested that you have an echocardiogram. Echocardiography is a painless test that uses sound waves to create images of your heart. It provides your doctor with information about the size and shape of your heart and how well your heart’s chambers and valves are working. This procedure takes approximately one hour. There are no restrictions for this procedure. ° ° ° ° °Follow-Up: °At [facility], you and your health needs are our priority.  As part of our continuing mission to provide you with exceptional heart care, we have created designated Provider Care Teams.  These Care Teams include your primary Cardiologist (physician) and Advanced Practice Providers (APPs -  Physician Assistants and Nurse Practitioners) who all work together to provide you with the care you need, when you need it. ° °We recommend signing up for the patient portal called "MyChart".  Sign up information is provided on this After Visit Summary.  MyChart is used to connect with patients for Virtual Visits (Telemedicine).  Patients are able to view lab/test results, encounter notes, upcoming appointments, etc.  Non-urgent messages can be sent to your provider as well.   °To learn more about what you can do with MyChart, go to [email or web address].   ° °Your next appointment:   °6  month(s) ° °The format for your next appointment:   °In Person ° °Provider:   °Robert Krasowski, MD ° ° °Other Instructions ° ° °Echocardiogram °An echocardiogram is a procedure that uses painless sound waves (ultrasound) to produce an image of the heart. Images from an echocardiogram can provide important information about: °· Signs of coronary artery disease (CAD). °· Aneurysm detection. An aneurysm is a weak or damaged part of an artery wall that bulges out from the normal force of blood pumping through the body. °· Heart size and shape. Changes in the size or shape of the heart can be associated with certain conditions, including heart failure, aneurysm, and CAD. °· Heart muscle function. °· Heart valve function. °· Signs of a past heart attack. °· Fluid buildup around the heart. °· Thickening of the heart muscle. °· A tumor or infectious growth around the heart valves. °Tell a health care provider about: °· Any allergies you have. °· All medicines you are taking, including vitamins, herbs, eye drops, creams, and over-the-counter medicines. °· Any blood disorders you have. °· Any surgeries you have had. °· Any medical conditions you have. °· Whether you are pregnant or may be pregnant. °What are the risks? °Generally, this is a safe procedure. However, problems may occur, including: °· Allergic reaction to dye (contrast) that may be used during the procedure. °What happens before the procedure? °No specific preparation is needed. You may eat and drink normally. °What happens during the procedure? ° °· An IV tube may be inserted into one of your veins. °· You may   receive contrast through this tube. A contrast is an injection that improves the quality of the pictures from your heart. °· A gel will be applied to your chest. °· A wand-like tool (transducer) will be moved over your chest. The gel will help to transmit the sound waves from the transducer. °· The sound waves will harmlessly bounce off of your heart to  allow the heart images to be captured in real-time motion. The images will be recorded on a computer. °The procedure may vary among health care providers and hospitals. °What happens after the procedure? °· You may return to your normal, everyday life, including diet, activities, and medicines, unless your health care provider tells you not to do that. °Summary °· An echocardiogram is a procedure that uses painless sound waves (ultrasound) to produce an image of the heart. °· Images from an echocardiogram can provide important information about the size and shape of your heart, heart muscle function, heart valve function, and fluid buildup around your heart. °· You do not need to do anything to prepare before this procedure. You may eat and drink normally. °· After the echocardiogram is completed, you may return to your normal, everyday life, unless your health care provider tells you not to do that. °This information is not intended to replace advice given to you by your health care provider. Make sure you discuss any questions you have with your health care provider. °Document Revised: 05/19/2018 Document Reviewed: 02/29/2016 °Elsevier Patient Education © 2020 [employer] ° ° °

## 2019-12-08 ENCOUNTER — Other Ambulatory Visit: Payer: Self-pay | Admitting: Cardiology

## 2019-12-22 ENCOUNTER — Ambulatory Visit (HOSPITAL_BASED_OUTPATIENT_CLINIC_OR_DEPARTMENT_OTHER)
Admission: RE | Admit: 2019-12-22 | Discharge: 2019-12-22 | Disposition: A | Discharge status: Home / Self Care | Payer: Medicare Other | Source: Ambulatory Visit | Attending: Cardiology | Admitting: Cardiology

## 2019-12-22 ENCOUNTER — Other Ambulatory Visit: Payer: Self-pay

## 2019-12-22 DIAGNOSIS — E782 Mixed hyperlipidemia: Secondary | ICD-10-CM

## 2019-12-22 DIAGNOSIS — I2510 Atherosclerotic heart disease of native coronary artery without angina pectoris: Secondary | ICD-10-CM | POA: Diagnosis not present

## 2019-12-22 DIAGNOSIS — I10 Essential (primary) hypertension: Secondary | ICD-10-CM | POA: Diagnosis not present

## 2019-12-22 LAB — ECHOCARDIOGRAM COMPLETE
Area-P 1/2: 2.87 cm2
S' Lateral: 2.92 cm

## 2020-05-14 ENCOUNTER — Other Ambulatory Visit: Payer: Self-pay

## 2020-05-16 ENCOUNTER — Encounter: Payer: Self-pay | Admitting: Cardiology

## 2020-05-16 ENCOUNTER — Other Ambulatory Visit: Payer: Self-pay

## 2020-05-16 ENCOUNTER — Ambulatory Visit: Payer: Medicare Other | Admitting: Cardiology

## 2020-05-16 VITALS — BP 120/60 | HR 81 | Ht 61.0 in | Wt 141.0 lb

## 2020-05-16 DIAGNOSIS — E782 Mixed hyperlipidemia: Secondary | ICD-10-CM | POA: Diagnosis not present

## 2020-05-16 DIAGNOSIS — I2510 Atherosclerotic heart disease of native coronary artery without angina pectoris: Secondary | ICD-10-CM | POA: Diagnosis not present

## 2020-05-16 DIAGNOSIS — I10 Essential (primary) hypertension: Secondary | ICD-10-CM

## 2020-05-16 NOTE — Patient Instructions (Signed)

## 2020-05-16 NOTE — Progress Notes (Signed)
Cardiology Office Note:    Date:  05/16/2020   ID:  Haley Walter, DOB March 10, 1940, MRN [id number]  PCP:  Kristopher Glee., MD  Cardiologist:  Jenne Campus, MD    Referring MD: Kristopher Glee., MD   Chief Complaint  Patient presents with  . Follow-up  Doing fine  History of Present Illness:    Haley Walter is a 80 y.o. female with past medical history significant for coronary artery disease, status post anterior wall myocardial infarction with complete lateral left anterior descending artery in spite of that her left ventricle ejection fraction still preserved, also hypertension, dyslipidemia.  She comes today to my office for follow-up overall she is doing well she still very active she participate in yoga activity as well as some exercises lately she started doing much more dynamic exercises with dancing which actually caused some chest tightness.  I wanted her to take some extra medication to prevent this episode but she said she does not want an extra medication assistance she simply will avoid those kind of exercises.  Still very active and doing quite well  Past Medical History:  Diagnosis Date  . Anxiety   . Arthritis   . Coronary artery disease   . Coronary artery disease involving native coronary artery of native heart without angina pectoris 11/28/2014   Overview:  Completely occluded left anterior descending artery  . Essential hypertension 02/24/2017  . Gastroesophageal reflux disease without esophagitis 01/07/2016  . High cholesterol   . Hypertension   . Hypertensive retinopathy of both eyes 09/27/2019  . Lumbar radiculopathy 07/04/2019  . MI (myocardial infarction) ([place])   . Mixed hyperlipidemia 11/28/2014  . Nuclear sclerotic cataract of both eyes 09/27/2019  . Posterior vitreous detachment of both eyes 09/27/2019  . Primary insomnia 07/01/2015  . Rectocele 11/29/2014  . Trigger ring finger of right hand 10/12/2019  . Vitreous floater, bilateral  09/27/2019    Past Surgical History:  Procedure Laterality Date  . ANTERIOR (CYSTOCELE) AND POSTERIOR REPAIR (RECTOCELE) WITH XENFORM GRAFT AND SACROSPINOUS FIXATION    . ANTERIOR AND POSTERIOR VAGINAL REPAIR W/ SACROSPINOUS LIGAMENT SUSPENSION    . BREAST CYST EXCISION    . CORONARY ANGIOPLASTY WITH STENT PLACEMENT    . INCONTINENCE SURGERY    . VAGINAL HYSTERECTOMY      Current Medications: Current Meds  Medication Sig  . ALPRAZolam (XANAX) 0.25 MG tablet Take 1 tablet (0.25 mg total) by mouth at bedtime as needed. (Patient taking differently: Take 0.25 mg by mouth at bedtime as needed for sleep.)  . amLODipine (NORVASC) 2.5 MG tablet TAKE 1 TABLET(2.5 MG) BY MOUTH DAILY (Patient taking differently: Take 2.5 mg by mouth daily.)  . aspirin EC 81 MG tablet Take 81 mg by mouth daily.  Marland Kitchen atorvastatin (LIPITOR) 40 MG tablet Take 1 tablet by mouth daily.  . Calcium Carb-Cholecalciferol (CALCIUM-VITAMIN D) 500-200 MG-UNIT tablet Take 1 tablet by mouth daily.  . diclofenac sodium (VOLTAREN) 1 % GEL Apply 1 application topically 3 (three) times daily.  . diclofenac-misoprostol (ARTHROTEC 75) 75-0.2 MG per tablet Take 1 tablet by mouth 2 (two) times daily.  . famotidine (PEPCID) 40 MG tablet Take 40 mg by mouth 2 (two) times daily.  Marland Kitchen ketoconazole (NIZORAL) 2 % cream Apply 1 application topically in the morning and at bedtime.  . metoprolol succinate (TOPROL-XL) 50 MG 24 hr tablet TAKE 1 AND 1/2 TABLETS BY  MOUTH DAILY (Patient taking differently: Take 50 mg by mouth daily. Take 1 and 1/2  tablets by mouth daily)  . nitroGLYCERIN (NITROSTAT) 0.4 MG SL tablet Place 1 tablet (0.4 mg total) under the tongue every 5 (five) minutes as needed for chest pain.  . ramipril (ALTACE) 10 MG capsule TAKE 1 CAPSULE BY MOUTH  DAILY (Patient taking differently: Take 10 mg by mouth daily.)  . risedronate (ACTONEL) 150 MG tablet Take 150 mg by mouth daily.  . rosuvastatin (CRESTOR) 40 MG tablet Take 40 mg by mouth  daily.     Allergies:   Penicillins   Social History   Socioeconomic History  . Marital status: Married    Spouse name: Not on file  . Number of children: Not on file  . Years of education: Not on file  . Highest education level: Not on file  Occupational History  . Not on file  Tobacco Use  . Smoking status: Never Smoker  . Smokeless tobacco: Never Used  Vaping Use  . Vaping Use: Never used  Substance and Sexual Activity  . Alcohol use: Yes  . Drug use: No  . Sexual activity: Not on file  Other Topics Concern  . Not on file  Social History Narrative  . Not on file   Social Determinants of Health   Financial Resource Strain: Not on file  Food Insecurity: Not on file  Transportation Needs: Not on file  Physical Activity: Not on file  Stress: Not on file  Social Connections: Not on file     Family History: The patient's family history includes Cancer in her mother; Heart disease in her father and maternal aunt; Stroke in her father. ROS:   Please see the history of present illness.    All 14 point review of systems negative except as described per history of present illness  EKGs/Labs/Other Studies Reviewed:      Recent Labs: No results found for requested labs within last 8760 hours.  Recent Lipid Panel    Component Value Date/Time   CHOL 192 12/14/2017 1205   TRIG 209 (H) 12/14/2017 1205   HDL 59 12/14/2017 1205   CHOLHDL 3.3 12/14/2017 1205   LDLCALC 91 12/14/2017 1205    Physical Exam:    VS:  BP 120/60 (BP Location: Left Arm, Patient Position: Sitting)   Pulse 81   Ht 5\' 1"  (1.549 m)   Wt 141 lb (64 kg)   SpO2 97%   BMI 26.64 kg/m     Wt Readings from Last 3 Encounters:  05/16/20 141 lb (64 kg)  12/04/19 145 lb (65.8 kg)  06/02/19 146 lb (66.2 kg)     GEN:  Well nourished, well developed in no acute distress HEENT: Normal NECK: No JVD; No carotid bruits LYMPHATICS: No lymphadenopathy CARDIAC: RRR, no murmurs, no rubs, no  gallops RESPIRATORY:  Clear to auscultation without rales, wheezing or rhonchi  ABDOMEN: Soft, non-tender, non-distended MUSCULOSKELETAL:  No edema; No deformity  SKIN: Warm and dry LOWER EXTREMITIES: no swelling NEUROLOGIC:  Alert and oriented x 3 PSYCHIATRIC:  Normal affect   ASSESSMENT:    1. Coronary artery disease involving native coronary artery of native heart without angina pectoris   2. Essential hypertension   3. Mixed hyperlipidemia    PLAN:    In order of problems listed above:  1. Coronary artery disease.  She does have some chest pain with extreme exertion she decided not to do those extreme exertion she does not want to have any extra medications.  I told her if this sensation became worse she need  to let me know. 2. Essential hypertension blood pressure well controlled continue present management. 3. Dyslipidemia I did review her fasting lipid profile from 2 months ago showing LDL of 61 and HDL of 49.  Her triglycerides are always elevated and we talked again about potentially taking fish oil.  She is willing to try it.   Medication Adjustments/Labs and Tests Ordered: Current medicines are reviewed at length with the patient today.  Concerns regarding medicines are outlined above.  No orders of the defined types were placed in this encounter.  Medication changes: No orders of the defined types were placed in this encounter.   Signed, Park Liter, MD, [facility] 05/16/2020 2:54 PM    [facility]

## 2020-05-17 NOTE — Addendum Note (Signed)
Addended by: Senaida Ores on: 05/17/2020 09:51 AM   Modules accepted: Orders

## 2020-06-13 ENCOUNTER — Other Ambulatory Visit: Payer: Self-pay | Admitting: Cardiology

## 2020-06-13 NOTE — Telephone Encounter (Signed)
Refill sent to pharmacy.   

## 2020-10-25 ENCOUNTER — Ambulatory Visit: Payer: Medicare Other | Admitting: Cardiology

## 2020-10-25 ENCOUNTER — Encounter: Payer: Self-pay | Admitting: Cardiology

## 2020-10-25 ENCOUNTER — Other Ambulatory Visit: Payer: Self-pay

## 2020-10-25 VITALS — BP 128/60 | HR 64 | Ht 62.0 in | Wt 143.0 lb

## 2020-10-25 DIAGNOSIS — I255 Ischemic cardiomyopathy: Secondary | ICD-10-CM

## 2020-10-25 DIAGNOSIS — E7800 Pure hypercholesterolemia, unspecified: Secondary | ICD-10-CM

## 2020-10-25 DIAGNOSIS — I2510 Atherosclerotic heart disease of native coronary artery without angina pectoris: Secondary | ICD-10-CM

## 2020-10-25 NOTE — Progress Notes (Signed)
Cardiology Office Note:    Date:  10/25/2020   ID:  Haley Walter, DOB [birth year], MRN [id number]  PCP:  Kristopher Glee., MD  Cardiologist:  Jenne Campus, MD    Referring MD: Kristopher Glee., MD   Chief Complaint  Patient presents with   Follow-up  Doing fine  History of Present Illness:    Haley Walter is a 80 y.o. female with past medical history significant for coronary artery disease, years ago she suffered from myocardial infarction she did have PTCA and stenting done on LAD after that she went back to [place] for wedding she did have some chest pain there when she came back to [place] cardiac catheterization was done again she was find to have completely occluded left anterior descending artery.  That happen about 20 years ago.  Since that time she is doing well denies have any chest pain tightness squeezing pressure burning chest still very active and energetic and for someone who is 80 years old she is doing quite well.  Past Medical History:  Diagnosis Date   Anxiety    Arthritis    Coronary artery disease    Coronary artery disease involving native coronary artery of native heart without angina pectoris 11/28/2014   Overview:  Completely occluded left anterior descending artery   Essential hypertension 02/24/2017   Gastroesophageal reflux disease without esophagitis 01/07/2016   High cholesterol    Hypertension    Hypertensive retinopathy of both eyes 09/27/2019   Lumbar radiculopathy 07/04/2019   MI (myocardial infarction) ([place])    Mixed hyperlipidemia 11/28/2014   Nuclear sclerotic cataract of both eyes 09/27/2019   Posterior vitreous detachment of both eyes 09/27/2019   Primary insomnia 07/01/2015   Rectocele 11/29/2014   Trigger ring finger of right hand 10/12/2019   Vitreous floater, bilateral 09/27/2019    Past Surgical History:  Procedure Laterality Date   ANTERIOR (CYSTOCELE) AND POSTERIOR REPAIR (RECTOCELE) WITH XENFORM GRAFT AND  SACROSPINOUS FIXATION     ANTERIOR AND POSTERIOR VAGINAL REPAIR W/ SACROSPINOUS LIGAMENT SUSPENSION     BREAST CYST EXCISION     CORONARY ANGIOPLASTY WITH STENT PLACEMENT     INCONTINENCE SURGERY     VAGINAL HYSTERECTOMY      Current Medications: Current Meds  Medication Sig   ALPRAZolam (XANAX) 0.25 MG tablet Take 1 tablet (0.25 mg total) by mouth at bedtime as needed. (Patient taking differently: Take 0.25 mg by mouth at bedtime as needed for sleep.)   amLODipine (NORVASC) 2.5 MG tablet TAKE 1 TABLET(2.5 MG) BY MOUTH DAILY (Patient taking differently: Take 2.5 mg by mouth daily.)   aspirin EC 81 MG tablet Take 81 mg by mouth daily.   atorvastatin (LIPITOR) 40 MG tablet Take 1 tablet by mouth daily.   Calcium Carb-Cholecalciferol (CALCIUM-VITAMIN D) 500-200 MG-UNIT tablet Take 1 tablet by mouth daily.   diclofenac sodium (VOLTAREN) 1 % GEL Apply 1 application topically 3 (three) times daily.   diclofenac-misoprostol (ARTHROTEC 75) 75-0.2 MG per tablet Take 1 tablet by mouth 2 (two) times daily.   famotidine (PEPCID) 40 MG tablet Take 40 mg by mouth 2 (two) times daily.   ketoconazole (NIZORAL) 2 % cream Apply 1 application topically in the morning and at bedtime.   metoprolol succinate (TOPROL-XL) 50 MG 24 hr tablet TAKE 1 AND 1/2 TABLETS BY  MOUTH DAILY (Patient taking differently: Take 50 mg by mouth daily. Take 1 and 1/2 tablets by mouth daily)   nitroGLYCERIN (NITROSTAT) 0.4 MG SL tablet PLACE  1 TABLET UNDER THE TONGUE EVERY 5 MINUTES AS NEEDED FOR CHEST PAIN, AS DIRECTED (Patient taking differently: Place 0.4 mg under the tongue every 5 (five) minutes as needed for chest pain.)   ramipril (ALTACE) 10 MG capsule TAKE 1 CAPSULE BY MOUTH  DAILY (Patient taking differently: Take 10 mg by mouth daily.)   risedronate (ACTONEL) 150 MG tablet Take 150 mg by mouth daily.   rosuvastatin (CRESTOR) 40 MG tablet Take 40 mg by mouth daily.     Allergies:   Penicillins   Social History    Socioeconomic History   Marital status: Married    Spouse name: Not on file   Number of children: Not on file   Years of education: Not on file   Highest education level: Not on file  Occupational History   Not on file  Tobacco Use   Smoking status: Never   Smokeless tobacco: Never  Vaping Use   Vaping Use: Never used  Substance and Sexual Activity   Alcohol use: Yes   Drug use: No   Sexual activity: Not on file  Other Topics Concern   Not on file  Social History Narrative   Not on file   Social Determinants of Health   Financial Resource Strain: Not on file  Food Insecurity: Not on file  Transportation Needs: Not on file  Physical Activity: Not on file  Stress: Not on file  Social Connections: Not on file     Family History: The patient's family history includes Cancer in her mother; Heart disease in her father and maternal aunt; Stroke in her father. ROS:   Please see the history of present illness.    All 14 point review of systems negative except as described per history of present illness  EKGs/Labs/Other Studies Reviewed:      Recent Labs: No results found for requested labs within last 8760 hours.  Recent Lipid Panel    Component Value Date/Time   CHOL 192 12/14/2017 1205   TRIG 209 (H) 12/14/2017 1205   HDL 59 12/14/2017 1205   CHOLHDL 3.3 12/14/2017 1205   LDLCALC 91 12/14/2017 1205    Physical Exam:    VS:  BP 128/60 (BP Location: Left Arm, Patient Position: Sitting)   Pulse 64   Ht '5\' 2"'$  (1.575 m)   Wt 143 lb (64.9 kg)   SpO2 99%   BMI 26.16 kg/m     Wt Readings from Last 3 Encounters:  10/25/20 143 lb (64.9 kg)  05/16/20 141 lb (64 kg)  12/04/19 145 lb (65.8 kg)     GEN:  Well nourished, well developed in no acute distress HEENT: Normal NECK: No JVD; No carotid bruits LYMPHATICS: No lymphadenopathy CARDIAC: RRR, no murmurs, no rubs, no gallops RESPIRATORY:  Clear to auscultation without rales, wheezing or rhonchi  ABDOMEN:  Soft, non-tender, non-distended MUSCULOSKELETAL:  No edema; No deformity  SKIN: Warm and dry LOWER EXTREMITIES: no swelling NEUROLOGIC:  Alert and oriented x 3 PSYCHIATRIC:  Normal affect   ASSESSMENT:    1. Coronary artery disease involving native coronary artery of native heart without angina pectoris   2. High cholesterol   3. Ischemic cardiomyopathy    PLAN:    In order of problems listed above:  Coronary disease doing well from that point review asymptomatic and appropriate medication which I will continue.  That include antiplatelets therapy she is taking aspirin. She did notice her blood pressure being low and by herself she cut down her beta-blocker  I told her that is an appropriate step will go back to 75 mg of metoprolol succinate and I will ask her to stop amlodipine. Dyslipidemia I did review K PN which show me her LDL of 61 HDL 49 done by primary care physician's good cholesterol profile continue present management. We did talk about potentially doing echocardiogram however she does not want to.   Medication Adjustments/Labs and Tests Ordered: Current medicines are reviewed at length with the patient today.  Concerns regarding medicines are outlined above.  No orders of the defined types were placed in this encounter.  Medication changes: No orders of the defined types were placed in this encounter.   Signed, Park Liter, MD, [facility] 10/25/2020 1:57 PM    [place]

## 2020-10-25 NOTE — Patient Instructions (Signed)

## 2021-01-07 ENCOUNTER — Other Ambulatory Visit: Payer: Self-pay | Admitting: Cardiology

## 2021-01-24 DIAGNOSIS — L570 Actinic keratosis: Secondary | ICD-10-CM

## 2021-01-24 DIAGNOSIS — L219 Seborrheic dermatitis, unspecified: Secondary | ICD-10-CM | POA: Insufficient documentation

## 2021-01-24 DIAGNOSIS — D1801 Hemangioma of skin and subcutaneous tissue: Secondary | ICD-10-CM

## 2021-01-24 DIAGNOSIS — L821 Other seborrheic keratosis: Secondary | ICD-10-CM

## 2021-01-24 HISTORY — DX: Seborrheic dermatitis, unspecified: L21.9

## 2021-01-24 HISTORY — DX: Actinic keratosis: L57.0

## 2021-01-24 HISTORY — DX: Hemangioma of skin and subcutaneous tissue: D18.01

## 2021-01-24 HISTORY — DX: Other seborrheic keratosis: L82.1

## 2021-04-29 ENCOUNTER — Encounter: Payer: Self-pay | Admitting: Cardiology

## 2021-04-29 ENCOUNTER — Other Ambulatory Visit: Payer: Self-pay

## 2021-04-29 ENCOUNTER — Ambulatory Visit: Payer: Medicare Other | Admitting: Cardiology

## 2021-04-29 VITALS — BP 134/68 | HR 71 | Ht 61.5 in | Wt 143.0 lb

## 2021-04-29 DIAGNOSIS — I10 Essential (primary) hypertension: Secondary | ICD-10-CM

## 2021-04-29 DIAGNOSIS — I2510 Atherosclerotic heart disease of native coronary artery without angina pectoris: Secondary | ICD-10-CM | POA: Diagnosis not present

## 2021-04-29 DIAGNOSIS — I255 Ischemic cardiomyopathy: Secondary | ICD-10-CM

## 2021-04-29 DIAGNOSIS — E7800 Pure hypercholesterolemia, unspecified: Secondary | ICD-10-CM

## 2021-04-29 NOTE — Patient Instructions (Signed)

## 2021-04-29 NOTE — Progress Notes (Signed)
?Cardiology Office Note:   ? ?Date:  04/29/2021  ? ?IDEnisa Walter, DOB 1941/01/02, MRN [id number] ? ?PCP:  Kristopher Glee., MD  ?Cardiologist:  Jenne Campus, MD   ? ?Referring MD: Kristopher Glee., MD  ? ?Chief Complaint  ?Patient presents with  ? Follow-up  ?Doing wel ? ?History of Present Illness:   ? ?Haley Walter is a 81 y.o. female  with past medical history significant for coronary artery disease, status post myocardial infarction probably until a long time ago.  Ischemic cardiomyopathy, dyslipidemia.  Comes to my office for follow-up.  Overall she is doing very well.  Denies have any chest pain tightness squeezing pressure burning chest I was very optimistic and joking a lot.  Still goes to gym on the regular basis and after admit for somebody who is 81 years old she looks very good. ? ?Past Medical History:  ?Diagnosis Date  ? Anxiety   ? Arthritis   ? Coronary artery disease   ? Coronary artery disease involving native coronary artery of native heart without angina pectoris 11/28/2014  ? Overview:  Completely occluded left anterior descending artery  ? Essential hypertension 02/24/2017  ? Gastroesophageal reflux disease without esophagitis 01/07/2016  ? High cholesterol   ? Hypertension   ? Hypertensive retinopathy of both eyes 09/27/2019  ? Lumbar radiculopathy 07/04/2019  ? MI (myocardial infarction) ([place])   ? Mixed hyperlipidemia 11/28/2014  ? Nuclear sclerotic cataract of both eyes 09/27/2019  ? Posterior vitreous detachment of both eyes 09/27/2019  ? Primary insomnia 07/01/2015  ? Rectocele 11/29/2014  ? Trigger ring finger of right hand 10/12/2019  ? Vitreous floater, bilateral 09/27/2019  ? ? ?Past Surgical History:  ?Procedure Laterality Date  ? ANTERIOR (CYSTOCELE) AND POSTERIOR REPAIR (RECTOCELE) WITH XENFORM GRAFT AND SACROSPINOUS FIXATION    ? ANTERIOR AND POSTERIOR VAGINAL REPAIR W/ SACROSPINOUS LIGAMENT SUSPENSION    ? BREAST CYST EXCISION    ? CORONARY ANGIOPLASTY WITH STENT  PLACEMENT    ? INCONTINENCE SURGERY    ? VAGINAL HYSTERECTOMY    ? ? ?Current Medications: ?Current Meds  ?Medication Sig  ? ALPRAZolam (XANAX) 0.25 MG tablet Take 1 tablet (0.25 mg total) by mouth at bedtime as needed. (Patient taking differently: Take 0.25 mg by mouth at bedtime as needed for sleep.)  ? amLODipine (NORVASC) 2.5 MG tablet TAKE 1 TABLET(2.5 MG) BY MOUTH DAILY (Patient taking differently: Take 2.5 mg by mouth daily.)  ? aspirin EC 81 MG tablet Take 81 mg by mouth daily.  ? atorvastatin (LIPITOR) 40 MG tablet Take 1 tablet by mouth daily.  ? Calcium Carb-Cholecalciferol (CALCIUM-VITAMIN D) 500-200 MG-UNIT tablet Take 1 tablet by mouth daily.  ? diclofenac sodium (VOLTAREN) 1 % GEL Apply 1 application topically 3 (three) times daily.  ? diclofenac-misoprostol (ARTHROTEC 75) 75-0.2 MG per tablet Take 1 tablet by mouth 2 (two) times daily.  ? famotidine (PEPCID) 40 MG tablet Take 40 mg by mouth 2 (two) times daily.  ? ketoconazole (NIZORAL) 2 % cream Apply 1 application topically in the morning and at bedtime.  ? metoprolol succinate (TOPROL-XL) 50 MG 24 hr tablet TAKE 1 AND 1/2 TABLETS BY  MOUTH DAILY (Patient taking differently: Take 50 mg by mouth daily. Take 1 and 1/2 tablets by mouth daily)  ? nitroGLYCERIN (NITROSTAT) 0.4 MG SL tablet PLACE 1 TABLET UNDER THE TONGUE EVERY 5 MINUTES AS NEEDED FOR CHEST PAIN, AS DIRECTED (Patient taking differently: Place 0.4 mg under the tongue every 5 (five)  minutes as needed for chest pain.)  ? ramipril (ALTACE) 10 MG capsule TAKE 1 CAPSULE BY MOUTH  DAILY (Patient taking differently: Take 10 mg by mouth daily.)  ? risedronate (ACTONEL) 150 MG tablet Take 150 mg by mouth daily.  ? rosuvastatin (CRESTOR) 40 MG tablet Take 40 mg by mouth daily.  ?  ? ?Allergies:   Penicillins  ? ?Social History  ? ?Socioeconomic History  ? Marital status: Married  ?  Spouse name: Not on file  ? Number of children: Not on file  ? Years of education: Not on file  ? Highest education  level: Not on file  ?Occupational History  ? Not on file  ?Tobacco Use  ? Smoking status: Never  ? Smokeless tobacco: Never  ?Vaping Use  ? Vaping Use: Never used  ?Substance and Sexual Activity  ? Alcohol use: Yes  ? Drug use: No  ? Sexual activity: Not on file  ?Other Topics Concern  ? Not on file  ?Social History Narrative  ? Not on file  ? ?Social Determinants of Health  ? ?Financial Resource Strain: Not on file  ?Food Insecurity: Not on file  ?Transportation Needs: Not on file  ?Physical Activity: Not on file  ?Stress: Not on file  ?Social Connections: Not on file  ?  ? ?Family History: ?The patient's family history includes Cancer in her mother; Heart disease in her father and maternal aunt; Stroke in her father. ?ROS:   ?Please see the history of present illness.    ?All 14 point review of systems negative except as described per history of present illness ? ?EKGs/Labs/Other Studies Reviewed:   ? ? ? ?Recent Labs: ?No results found for requested labs within last 8760 hours.  ?Recent Lipid Panel ?   ?Component Value Date/Time  ? CHOL 192 12/14/2017 1205  ? TRIG 209 (H) 12/14/2017 1205  ? HDL 59 12/14/2017 1205  ? CHOLHDL 3.3 12/14/2017 1205  ? [place] 91 12/14/2017 1205  ? ? ?Physical Exam:   ? ?VS:  BP 134/68 (BP Location: Left Arm, Patient Position: Sitting)   Pulse 71   Ht 5' 1.5" (1.562 m)   Wt 143 lb (64.9 kg)   SpO2 96%   BMI 26.58 kg/m?    ? ?Wt Readings from Last 3 Encounters:  ?04/29/21 143 lb (64.9 kg)  ?10/25/20 143 lb (64.9 kg)  ?05/16/20 141 lb (64 kg)  ?  ? ?GEN:  Well nourished, well developed in no acute distress ?HEENT: Normal ?NECK: No JVD; No carotid bruits ?LYMPHATICS: No lymphadenopathy ?CARDIAC: RRR, no murmurs, no rubs, no gallops ?RESPIRATORY:  Clear to auscultation without rales, wheezing or rhonchi  ?ABDOMEN: Soft, non-tender, non-distended ?MUSCULOSKELETAL:  No edema; No deformity  ?SKIN: Warm and dry ?LOWER EXTREMITIES: no swelling ?NEUROLOGIC:  Alert and oriented x  3 ?PSYCHIATRIC:  Normal affect  ? ?ASSESSMENT:   ? ?1. Coronary artery disease involving native coronary artery of native heart without angina pectoris   ?2. Essential hypertension   ?3. Ischemic cardiomyopathy   ?4. High cholesterol   ? ?PLAN:   ? ?In order of problems listed above: ? ?Coronary artery disease stable from that point review on antiplatelet therapy, denies having any chest pain tightness squeezing pressure burning chest.  No angina pectoris. ?Dyslipidemia I did review data done by primary care physician.  Her LDL is 68, will continue high intense statin. ?Ischemic cardiomyopathy but completely asymptomatic.  On appropriate medications which include Altace as well as Toprol-XL.  She  has been stable for years and happy with the way that she is feeling I will not alter any of her medications. ?We did talk about healthy lifestyle need to exercise on the regular basis which she is already doing a good diet. ? ? ?Medication Adjustments/Labs and Tests Ordered: ?Current medicines are reviewed at length with the patient today.  Concerns regarding medicines are outlined above.  ?No orders of the defined types were placed in this encounter. ? ?Medication changes: No orders of the defined types were placed in this encounter. ? ? ?Signed, ?Park Liter, MD, [facility] ?04/29/2021 1:50 PM    ?[place] ? ?

## 2021-07-24 DIAGNOSIS — D489 Neoplasm of uncertain behavior, unspecified: Secondary | ICD-10-CM | POA: Insufficient documentation

## 2021-07-24 DIAGNOSIS — L578 Other skin changes due to chronic exposure to nonionizing radiation: Secondary | ICD-10-CM | POA: Insufficient documentation

## 2021-07-24 DIAGNOSIS — L814 Other melanin hyperpigmentation: Secondary | ICD-10-CM | POA: Insufficient documentation

## 2021-07-24 DIAGNOSIS — D229 Melanocytic nevi, unspecified: Secondary | ICD-10-CM

## 2021-07-24 HISTORY — DX: Other skin changes due to chronic exposure to nonionizing radiation: L57.8

## 2021-07-24 HISTORY — DX: Melanocytic nevi, unspecified: D22.9

## 2021-07-24 HISTORY — DX: Other melanin hyperpigmentation: L81.4

## 2021-07-24 HISTORY — DX: Neoplasm of uncertain behavior, unspecified: D48.9

## 2021-10-16 ENCOUNTER — Ambulatory Visit: Payer: Medicare Other | Attending: Cardiology | Admitting: Cardiology

## 2021-10-16 ENCOUNTER — Encounter: Payer: Self-pay | Admitting: Cardiology

## 2021-10-16 VITALS — BP 132/64 | HR 68 | Ht 61.0 in | Wt 144.0 lb

## 2021-10-16 DIAGNOSIS — E7800 Pure hypercholesterolemia, unspecified: Secondary | ICD-10-CM

## 2021-10-16 DIAGNOSIS — I2510 Atherosclerotic heart disease of native coronary artery without angina pectoris: Secondary | ICD-10-CM | POA: Diagnosis not present

## 2021-10-16 DIAGNOSIS — I10 Essential (primary) hypertension: Secondary | ICD-10-CM | POA: Diagnosis not present

## 2021-10-16 NOTE — Progress Notes (Unsigned)
Cardiology Office Note:    Date:  10/16/2021   ID:  Haley Walter, DOB November 01, 1940, MRN [id number]  PCP:  Kristopher Glee., MD  Cardiologist:  Jenne Campus, MD    Referring MD: Kristopher Glee., MD   Chief Complaint  Patient presents with   Follow-up    History of Present Illness:    Haley Walter is a 81 y.o. female with past medical history significant for coronary artery disease, status post myocardial infarction more than 20 years ago involving LAD territory, she does have ischemic cardiomyopathy however left ventricle ejection fraction has been stable for years, dyslipidemia. She is in my office to follow-up.  Overall she is doing very well still goes to gym on the regular basis have no difficulty doing it.  No cardiac complaints no chest pain tightness squeezing pressure burning chest  Past Medical History:  Diagnosis Date   Anxiety    Arthritis    Coronary artery disease    Coronary artery disease involving native coronary artery of native heart without angina pectoris 11/28/2014   Overview:  Completely occluded left anterior descending artery   Essential hypertension 02/24/2017   Gastroesophageal reflux disease without esophagitis 01/07/2016   High cholesterol    Hypertension    Hypertensive retinopathy of both eyes 09/27/2019   Lumbar radiculopathy 07/04/2019   MI (myocardial infarction) ([place])    Mixed hyperlipidemia 11/28/2014   Nuclear sclerotic cataract of both eyes 09/27/2019   Posterior vitreous detachment of both eyes 09/27/2019   Primary insomnia 07/01/2015   Rectocele 11/29/2014   Trigger ring finger of right hand 10/12/2019   Vitreous floater, bilateral 09/27/2019    Past Surgical History:  Procedure Laterality Date   ANTERIOR (CYSTOCELE) AND POSTERIOR REPAIR (RECTOCELE) WITH XENFORM GRAFT AND SACROSPINOUS FIXATION     ANTERIOR AND POSTERIOR VAGINAL REPAIR W/ SACROSPINOUS LIGAMENT SUSPENSION     BREAST CYST EXCISION     CORONARY ANGIOPLASTY  WITH STENT PLACEMENT     INCONTINENCE SURGERY     VAGINAL HYSTERECTOMY      Current Medications: Current Meds  Medication Sig   ALPRAZolam (XANAX) 0.25 MG tablet Take 1 tablet (0.25 mg total) by mouth at bedtime as needed. (Patient taking differently: Take 0.25 mg by mouth at bedtime as needed for sleep.)   amLODipine (NORVASC) 2.5 MG tablet TAKE 1 TABLET(2.5 MG) BY MOUTH DAILY (Patient taking differently: Take 2.5 mg by mouth daily.)   aspirin EC 81 MG tablet Take 81 mg by mouth daily.   atorvastatin (LIPITOR) 40 MG tablet Take 1 tablet by mouth daily.   Calcium Carb-Cholecalciferol (CALCIUM-VITAMIN D) 500-200 MG-UNIT tablet Take 1 tablet by mouth daily.   diclofenac sodium (VOLTAREN) 1 % GEL Apply 1 application topically 3 (three) times daily.   diclofenac-misoprostol (ARTHROTEC 75) 75-0.2 MG per tablet Take 1 tablet by mouth 2 (two) times daily.   famotidine (PEPCID) 40 MG tablet Take 40 mg by mouth 2 (two) times daily.   ketoconazole (NIZORAL) 2 % cream Apply 1 application topically in the morning and at bedtime.   metoprolol succinate (TOPROL-XL) 50 MG 24 hr tablet TAKE 1 AND 1/2 TABLETS BY  MOUTH DAILY (Patient taking differently: Take 50 mg by mouth daily. Take 1 and 1/2 tablets by mouth daily)   nitroGLYCERIN (NITROSTAT) 0.4 MG SL tablet PLACE 1 TABLET UNDER THE TONGUE EVERY 5 MINUTES AS NEEDED FOR CHEST PAIN, AS DIRECTED (Patient taking differently: Place 0.4 mg under the tongue every 5 (five) minutes as needed for chest  pain.)   ramipril (ALTACE) 10 MG capsule TAKE 1 CAPSULE BY MOUTH  DAILY (Patient taking differently: Take 10 mg by mouth daily.)   risedronate (ACTONEL) 150 MG tablet Take 150 mg by mouth daily.   rosuvastatin (CRESTOR) 40 MG tablet Take 40 mg by mouth daily.     Allergies:   Penicillins   Social History   Socioeconomic History   Marital status: Married    Spouse name: Not on file   Number of children: Not on file   Years of education: Not on file   Highest  education level: Not on file  Occupational History   Not on file  Tobacco Use   Smoking status: Never   Smokeless tobacco: Never  Vaping Use   Vaping Use: Never used  Substance and Sexual Activity   Alcohol use: Yes   Drug use: No   Sexual activity: Not on file  Other Topics Concern   Not on file  Social History Narrative   Not on file   Social Determinants of Health   Financial Resource Strain: Not on file  Food Insecurity: Not on file  Transportation Needs: Not on file  Physical Activity: Not on file  Stress: Not on file  Social Connections: Not on file     Family History: The patient's family history includes Cancer in her mother; Heart disease in her father and maternal aunt; Stroke in her father. ROS:   Please see the history of present illness.    All 14 point review of systems negative except as described per history of present illness  EKGs/Labs/Other Studies Reviewed:      Recent Labs: No results found for requested labs within last 365 days.  Recent Lipid Panel    Component Value Date/Time   CHOL 192 12/14/2017 1205   TRIG 209 (H) 12/14/2017 1205   HDL 59 12/14/2017 1205   CHOLHDL 3.3 12/14/2017 1205   LDLCALC 91 12/14/2017 1205    Physical Exam:    VS:  BP 132/64 (BP Location: Left Arm, Patient Position: Sitting)   Pulse 68   Ht '5\' 1"'$  (1.549 m)   Wt 144 lb (65.3 kg)   SpO2 97%   BMI 27.21 kg/m     Wt Readings from Last 3 Encounters:  10/16/21 144 lb (65.3 kg)  04/29/21 143 lb (64.9 kg)  10/25/20 143 lb (64.9 kg)     GEN:  Well nourished, well developed in no acute distress HEENT: Normal NECK: No JVD; No carotid bruits LYMPHATICS: No lymphadenopathy CARDIAC: RRR, no murmurs, no rubs, no gallops RESPIRATORY:  Clear to auscultation without rales, wheezing or rhonchi  ABDOMEN: Soft, non-tender, non-distended MUSCULOSKELETAL:  No edema; No deformity  SKIN: Warm and dry LOWER EXTREMITIES: no swelling NEUROLOGIC:  Alert and oriented x  3 PSYCHIATRIC:  Normal affect   ASSESSMENT:    1. Coronary artery disease involving native coronary artery of native heart without angina pectoris   2. 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  She does not want to change any of her medications we will continue present management Dyslipidemia, I did review her fasting lipid profile done 8 months ago with LDL of 88 HDL 52.  She is taking Crestor 40 and she does not want  to do anything about that. Essential hypertension: Blood pressure well controlled   Medication Adjustments/Labs and Tests Ordered: Current medicines are reviewed at length with the patient today.  Concerns regarding medicines are outlined above.  No orders of the defined types were placed in this encounter.  Medication changes: No orders of the defined types were placed in this encounter.   Signed, Park Liter, MD, [facility] 10/16/2021 2:33 PM    [place]

## 2021-10-16 NOTE — Patient Instructions (Signed)

## 2022-01-01 ENCOUNTER — Other Ambulatory Visit: Payer: Self-pay | Admitting: Cardiology

## 2022-04-21 ENCOUNTER — Encounter: Payer: Self-pay | Admitting: Cardiology

## 2022-04-21 ENCOUNTER — Ambulatory Visit: Payer: Medicare Other | Attending: Cardiology | Admitting: Cardiology

## 2022-04-21 VITALS — BP 124/72 | HR 62 | Ht 61.0 in | Wt 144.0 lb

## 2022-04-21 DIAGNOSIS — I2510 Atherosclerotic heart disease of native coronary artery without angina pectoris: Secondary | ICD-10-CM | POA: Diagnosis not present

## 2022-04-21 DIAGNOSIS — E782 Mixed hyperlipidemia: Secondary | ICD-10-CM

## 2022-04-21 DIAGNOSIS — I10 Essential (primary) hypertension: Secondary | ICD-10-CM

## 2022-04-21 NOTE — Progress Notes (Signed)
Cardiology Office Note:    Date:  04/21/2022   ID:  Haley Walter, DOB 09-06-1940, MRN [id number]  PCP:  Kristopher Glee., MD  Cardiologist:  Jenne Campus, MD    Referring MD: Kristopher Glee., MD   No chief complaint on file. Doing well  History of Present Illness:    Haley Walter is a 82 y.o. female  le with past medical history significant for coronary artery disease, status post myocardial infarction more than 20 years ago involving LAD territory, she does have ischemic cardiomyopathy however left ventricle ejection fraction has been stable for years, dyslipidemia.  Comes today to my office for follow-up overall doing very well.  Still have a lot of energy still exercise on the regular basis with no difficulties.  Denies have any chest pain tightness squeezing pressure burning chest.  Past Medical History:  Diagnosis Date   Anxiety    Arthritis    Coronary artery disease    Coronary artery disease involving native coronary artery of native heart without angina pectoris 11/28/2014   Overview:  Completely occluded left anterior descending artery   Essential hypertension 02/24/2017   Gastroesophageal reflux disease without esophagitis 01/07/2016   High cholesterol    Hypertension    Hypertensive retinopathy of both eyes 09/27/2019   Lumbar radiculopathy 07/04/2019   MI (myocardial infarction) ([place])    Mixed hyperlipidemia 11/28/2014   Nuclear sclerotic cataract of both eyes 09/27/2019   Posterior vitreous detachment of both eyes 09/27/2019   Primary insomnia 07/01/2015   Rectocele 11/29/2014   Trigger ring finger of right hand 10/12/2019   Vitreous floater, bilateral 09/27/2019    Past Surgical History:  Procedure Laterality Date   ANTERIOR (CYSTOCELE) AND POSTERIOR REPAIR (RECTOCELE) WITH XENFORM GRAFT AND SACROSPINOUS FIXATION     ANTERIOR AND POSTERIOR VAGINAL REPAIR W/ SACROSPINOUS LIGAMENT SUSPENSION     BREAST CYST EXCISION     CORONARY ANGIOPLASTY WITH  STENT PLACEMENT     INCONTINENCE SURGERY     VAGINAL HYSTERECTOMY      Current Medications: Current Meds  Medication Sig   ALPRAZolam (XANAX) 0.25 MG tablet Take 1 tablet (0.25 mg total) by mouth at bedtime as needed. (Patient taking differently: Take 0.25 mg by mouth at bedtime as needed for sleep.)   amLODipine (NORVASC) 2.5 MG tablet Take 1 tablet (2.5 mg total) by mouth daily.   aspirin EC 81 MG tablet Take 81 mg by mouth daily.   atorvastatin (LIPITOR) 40 MG tablet Take 1 tablet by mouth daily.   Calcium Carb-Cholecalciferol (CALCIUM-VITAMIN D) 500-200 MG-UNIT tablet Take 1 tablet by mouth daily.   diclofenac sodium (VOLTAREN) 1 % GEL Apply 1 application topically 3 (three) times daily.   diclofenac-misoprostol (ARTHROTEC 75) 75-0.2 MG per tablet Take 1 tablet by mouth 2 (two) times daily.   famotidine (PEPCID) 40 MG tablet Take 40 mg by mouth 2 (two) times daily.   ketoconazole (NIZORAL) 2 % cream Apply 1 application topically in the morning and at bedtime.   metoprolol succinate (TOPROL-XL) 50 MG 24 hr tablet TAKE 1 AND 1/2 TABLETS BY  MOUTH DAILY (Patient taking differently: Take 50 mg by mouth daily. Take 1 and 1/2 tablets by mouth daily)   nitroGLYCERIN (NITROSTAT) 0.4 MG SL tablet PLACE 1 TABLET UNDER THE TONGUE EVERY 5 MINUTES AS NEEDED FOR CHEST PAIN, AS DIRECTED (Patient taking differently: Place 0.4 mg under the tongue every 5 (five) minutes as needed for chest pain.)   ramipril (ALTACE) 10 MG capsule TAKE  1 CAPSULE BY MOUTH  DAILY (Patient taking differently: Take 10 mg by mouth daily.)   risedronate (ACTONEL) 150 MG tablet Take 150 mg by mouth daily.   rosuvastatin (CRESTOR) 40 MG tablet Take 40 mg by mouth daily.     Allergies:   Penicillins   Social History   Socioeconomic History   Marital status: Married    Spouse name: Not on file   Number of children: Not on file   Years of education: Not on file   Highest education level: Not on file  Occupational History    Not on file  Tobacco Use   Smoking status: Never   Smokeless tobacco: Never  Vaping Use   Vaping Use: Never used  Substance and Sexual Activity   Alcohol use: Yes   Drug use: No   Sexual activity: Not on file  Other Topics Concern   Not on file  Social History Narrative   Not on file   Social Determinants of Health   Financial Resource Strain: Not on file  Food Insecurity: Not on file  Transportation Needs: Not on file  Physical Activity: Not on file  Stress: Not on file  Social Connections: Not on file     Family History: The patient's family history includes Cancer in her mother; Heart disease in her father and maternal aunt; Stroke in her father. ROS:   Please see the history of present illness.    All 14 point review of systems negative except as described per history of present illness  EKGs/Labs/Other Studies Reviewed:      Recent Labs: No results found for requested labs within last 365 days.  Recent Lipid Panel    Component Value Date/Time   CHOL 192 12/14/2017 1205   TRIG 209 (H) 12/14/2017 1205   HDL 59 12/14/2017 1205   CHOLHDL 3.3 12/14/2017 1205   LDLCALC 91 12/14/2017 1205    Physical Exam:    VS:  BP 124/72 (BP Location: Left Arm, Patient Position: Sitting, Cuff Size: Normal)   Pulse 62   Ht '5\' 1"'$  (1.549 m)   Wt 144 lb (65.3 kg)   SpO2 97%   BMI 27.21 kg/m     Wt Readings from Last 3 Encounters:  04/21/22 144 lb (65.3 kg)  10/16/21 144 lb (65.3 kg)  04/29/21 143 lb (64.9 kg)     GEN:  Well nourished, well developed in no acute distress HEENT: Normal NECK: No JVD; No carotid bruits LYMPHATICS: No lymphadenopathy CARDIAC: RRR, no murmurs, no rubs, no gallops RESPIRATORY:  Clear to auscultation without rales, wheezing or rhonchi  ABDOMEN: Soft, non-tender, non-distended MUSCULOSKELETAL:  No edema; No deformity  SKIN: Warm and dry LOWER EXTREMITIES: no swelling NEUROLOGIC:  Alert and oriented x 3 PSYCHIATRIC:  Normal affect    ASSESSMENT:    1. Coronary artery disease involving native coronary artery of native heart without angina pectoris   2. Essential hypertension   3. Mixed hyperlipidemia    PLAN:    In order of problems listed above:  Coronary disease stable from that point review we will continue present medication which include antiplatelets therapy. Essential hypertension blood pressure well-controlled continue present management. Dyslipidemia I did review her blood test done by primary care physician HDL 53 LDL 78 triglycerides 244 and we had a long discussion about what to do about triglycerides.  She is already exercising on the regular basis I told her to avoid sweets also told her she can take some fish oil.  She said she will try.   Medication Adjustments/Labs and Tests Ordered: Current medicines are reviewed at length with the patient today.  Concerns regarding medicines are outlined above.  No orders of the defined types were placed in this encounter.  Medication changes: No orders of the defined types were placed in this encounter.   Signed, Park Liter, MD, [facility] 04/21/2022 3:38 PM    [place]

## 2022-04-21 NOTE — Patient Instructions (Signed)

## 2022-05-07 DIAGNOSIS — S76311A Strain of muscle, fascia and tendon of the posterior muscle group at thigh level, right thigh, initial encounter: Secondary | ICD-10-CM

## 2022-05-07 HISTORY — DX: Strain of muscle, fascia and tendon of the posterior muscle group at thigh level, right thigh, initial encounter: S76.311A

## 2022-09-30 ENCOUNTER — Other Ambulatory Visit: Payer: Self-pay | Admitting: Cardiology

## 2023-01-25 ENCOUNTER — Encounter: Payer: Self-pay | Admitting: Cardiology

## 2023-01-26 ENCOUNTER — Encounter: Payer: Self-pay | Admitting: Cardiology

## 2023-01-26 ENCOUNTER — Ambulatory Visit: Payer: Medicare Other | Attending: Cardiology | Admitting: Cardiology

## 2023-01-26 VITALS — BP 158/60 | HR 78 | Ht 61.5 in | Wt 141.0 lb

## 2023-01-26 DIAGNOSIS — I10 Essential (primary) hypertension: Secondary | ICD-10-CM | POA: Diagnosis not present

## 2023-01-26 DIAGNOSIS — I255 Ischemic cardiomyopathy: Secondary | ICD-10-CM | POA: Diagnosis not present

## 2023-01-26 DIAGNOSIS — E7800 Pure hypercholesterolemia, unspecified: Secondary | ICD-10-CM

## 2023-01-26 DIAGNOSIS — M1990 Unspecified osteoarthritis, unspecified site: Secondary | ICD-10-CM | POA: Diagnosis not present

## 2023-01-26 DIAGNOSIS — I2510 Atherosclerotic heart disease of native coronary artery without angina pectoris: Secondary | ICD-10-CM

## 2023-01-26 DIAGNOSIS — M25551 Pain in right hip: Secondary | ICD-10-CM

## 2023-01-26 DIAGNOSIS — R0609 Other forms of dyspnea: Secondary | ICD-10-CM | POA: Diagnosis not present

## 2023-01-26 NOTE — Progress Notes (Signed)
Cardiology Office Note:    Date:  01/26/2023   ID:  Haley Walter, DOB January 26, 1941, MRN [id number]  PCP:  Andreas Blower., MD  Cardiologist:  Gypsy Balsam, MD    Referring MD: Andreas Blower., MD   Chief Complaint  Patient presents with   Follow-up    History of Present Illness:    Haley Walter is a 82 y.o. female past medical history significant for coronary artery disease many years ago more than 20 she had myocardial infarction involving LAD territory does have segmental wall motion abnormality however ejection fraction is preserved, also dyslipidemia.  Comes today to months for follow-up overall cardiac wise doing well still go to gym on the regular basis and exercise.  Denies have any chest pain tightness squeezing pressure burning chest does have some exertional shortness of breath.  Biggest problem that she has a history of problems with her lower back that she does have some pain radiating towards the leg.  She did have some injection of steroids which did not much help then she went for physical therapy helped some.  Past Medical History:  Diagnosis Date   Actinic keratoses 01/24/2021   Anxiety    Arthritis    Cherry angioma 01/24/2021   Coronary artery disease    Coronary artery disease involving native coronary artery of native heart without angina pectoris 11/28/2014   Overview:  Completely occluded left anterior descending artery   Cystocele, unspecified 11/29/2014   Essential hypertension 02/24/2017   Gastroesophageal reflux disease without esophagitis 01/07/2016   Hamstring strain, right, initial encounter 05/07/2022   High cholesterol    Hypertension    Hypertensive retinopathy of both eyes 09/27/2019   Iron deficiency anemia 07/04/2019   Lentigo 07/24/2021   Lumbar radiculopathy 07/04/2019   MI (myocardial infarction) ([facility])    Mixed hyperlipidemia 11/28/2014   Multiple benign nevi 07/24/2021   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    Nuclear sclerotic cataract of both eyes 09/27/2019   Posterior vitreous detachment of both eyes 09/27/2019   Primary insomnia 07/01/2015   Rectocele 11/29/2014   Screening for osteoporosis 01/07/2016   Seborrheic dermatitis 01/24/2021   Seborrheic keratoses 01/24/2021   Stress incontinence in female 11/29/2014   Sun-damaged skin 07/24/2021   Trigger ring finger of right hand 10/12/2019   UTI (urinary tract infection) 11/29/2014   Vitamin D deficiency 07/01/2015   Vitreous floater, bilateral 09/27/2019    Past Surgical History:  Procedure Laterality Date   ANTERIOR (CYSTOCELE) AND POSTERIOR REPAIR (RECTOCELE) WITH XENFORM GRAFT AND SACROSPINOUS FIXATION     ANTERIOR AND POSTERIOR VAGINAL REPAIR W/ SACROSPINOUS LIGAMENT SUSPENSION     BREAST CYST EXCISION     CATARACT EXTRACTION     CORONARY ANGIOPLASTY WITH STENT PLACEMENT     INCONTINENCE SURGERY     VAGINAL HYSTERECTOMY      Current Medications: Current Meds  Medication Sig   ALPRAZolam (XANAX) 0.25 MG tablet Take 1 tablet (0.25 mg total) by mouth at bedtime as needed. (Patient taking differently: Take 0.25 mg by mouth at bedtime as needed for sleep.)   amLODipine (NORVASC) 2.5 MG tablet TAKE 1 TABLET(2.5 MG) BY MOUTH DAILY (Patient taking differently: Take 2.5 mg by mouth daily.)   aspirin EC 81 MG tablet Take 81 mg by mouth daily.   atorvastatin (LIPITOR) 40 MG tablet Take 1 tablet by mouth daily.   Calcium Carb-Cholecalciferol (CALCIUM-VITAMIN D) 500-200 MG-UNIT tablet Take 1 tablet by mouth daily.   diclofenac sodium (VOLTAREN) 1 % GEL  Apply 1 application topically 3 (three) times daily.   diclofenac-misoprostol (ARTHROTEC 75) 75-0.2 MG per tablet Take 1 tablet by mouth 2 (two) times daily.   famotidine (PEPCID) 40 MG tablet Take 40 mg by mouth 2 (two) times daily.   ketoconazole (NIZORAL) 2 % cream Apply 1 application topically in the morning and at bedtime.   meloxicam (MOBIC) 7.5 MG tablet Take 7.5 mg by mouth daily.    metoprolol succinate (TOPROL-XL) 50 MG 24 hr tablet TAKE 1 AND 1/2 TABLETS BY  MOUTH DAILY (Patient taking differently: Take 50 mg by mouth daily. Take 1 and 1/2 tablets by mouth daily)   moxifloxacin (VIGAMOX) 0.5 % ophthalmic solution Place 1 drop into the right eye 2 (two) times daily.   nitroGLYCERIN (NITROSTAT) 0.4 MG SL tablet PLACE 1 TABLET UNDER THE TONGUE EVERY 5 MINUTES AS NEEDED FOR CHEST PAIN, AS DIRECTED (Patient taking differently: Place 0.4 mg under the tongue every 5 (five) minutes as needed for chest pain.)   omeprazole (PRILOSEC) 20 MG capsule Take 20 mg by mouth daily.   ramipril (ALTACE) 10 MG capsule TAKE 1 CAPSULE BY MOUTH  DAILY (Patient taking differently: Take 10 mg by mouth daily.)   risedronate (ACTONEL) 150 MG tablet Take 150 mg by mouth daily.   rosuvastatin (CRESTOR) 40 MG tablet Take 40 mg by mouth daily.     Allergies:   Penicillins   Social History   Socioeconomic History   Marital status: Married    Spouse name: Not on file   Number of children: Not on file   Years of education: Not on file   Highest education level: Not on file  Occupational History   Not on file  Tobacco Use   Smoking status: Never   Smokeless tobacco: Never  Vaping Use   Vaping status: Never Used  Substance and Sexual Activity   Alcohol use: Yes   Drug use: No   Sexual activity: Not on file  Other Topics Concern   Not on file  Social History Narrative   Not on file   Social Drivers of Health   Financial Resource Strain: Not on file  Food Insecurity: Not on file  Transportation Needs: Not on file  Physical Activity: Not on file  Stress: Not on file  Social Connections: Not on file     Family History: The patient's family history includes Cancer in her mother; Heart disease in her father and maternal aunt; Stroke in her father. ROS:   Please see the history of present illness.    All 14 point review of systems negative except as described per history of present  illness  EKGs/Labs/Other Studies Reviewed:    EKG Interpretation Date/Time:  Tuesday January 26 2023 14:39:43 EST Ventricular Rate:  76 PR Interval:  190 QRS Duration:  72 QT Interval:  396 QTC Calculation: 445 R Axis:   -21  Text Interpretation: Normal sinus rhythm Low voltage QRS Nonspecific T wave abnormality No previous ECGs available Confirmed by Gypsy Balsam [phone]) on 01/26/2023 2:56:11 PM    Recent Labs: No results found for requested labs within last 365 days.  Recent Lipid Panel    Component Value Date/Time   CHOL 192 12/14/2017 1205   TRIG 209 (H) 12/14/2017 1205   HDL 59 12/14/2017 1205   CHOLHDL 3.3 12/14/2017 1205   LDLCALC 91 12/14/2017 1205    Physical Exam:    VS:  BP (!) 158/60 (BP Location: Right Arm, Patient Position: Sitting)   Pulse  78   Ht 5' 1.5" (1.562 m)   Wt 141 lb (64 kg)   SpO2 98%   BMI 26.21 kg/m     Wt Readings from Last 3 Encounters:  01/26/23 141 lb (64 kg)  04/21/22 144 lb (65.3 kg)  10/16/21 144 lb (65.3 kg)     GEN:  Well nourished, well developed in no acute distress HEENT: Normal NECK: No JVD; No carotid bruits LYMPHATICS: No lymphadenopathy CARDIAC: RRR, no murmurs, no rubs, no gallops RESPIRATORY:  Clear to auscultation without rales, wheezing or rhonchi  ABDOMEN: Soft, non-tender, non-distended MUSCULOSKELETAL:  No edema; No deformity  SKIN: Warm and dry LOWER EXTREMITIES: no swelling NEUROLOGIC:  Alert and oriented x 3 PSYCHIATRIC:  Normal affect   ASSESSMENT:    1. Essential hypertension   2. Right hip pain   3. Arthritis   4. Dyspnea on exertion   5. Coronary artery disease involving native coronary artery of native heart without angina pectoris   6. Ischemic cardiomyopathy   7. High cholesterol    PLAN:    In order of problems listed above:  Essential hypertension blood pressure well-controlled we will continue present medications. Dyslipidemia I did review K PN which show me her LDL of 78 we  will continue present management. Arthritis probably responsible for her symptomatology she requested to be referred to rheumatology which we will do. We did talk about healthy lifestyle need to exercise on the regular basis.  Medication Adjustments/Labs and Tests Ordered: Current medicines are reviewed at length with the patient today.  Concerns regarding medicines are outlined above.  Orders Placed This Encounter  Procedures   Ambulatory referral to Rheumatology   EKG 12-Lead   ECHOCARDIOGRAM COMPLETE   Medication changes: No orders of the defined types were placed in this encounter.   Signed, Georgeanna Lea, MD, [facility] 01/26/2023 3:54 PM    [facility]

## 2023-01-26 NOTE — Patient Instructions (Addendum)
Medication Instructions:  Your physician recommends that you continue on your current medications as directed. Please refer to the Current Medication list given to you today.  *If you need a refill on your cardiac medications before your next appointment, please call your pharmacy*   Lab Work: None Ordered If you have labs (blood work) drawn today and your tests are completely normal, you will receive your results only by: MyChart Message (if you have MyChart) OR A paper copy in the mail If you have any lab test that is abnormal or we need to change your treatment, we will call you to review the results.   Testing/Procedures: Your physician has requested that you have an echocardiogram. Echocardiography is a painless test that uses sound waves to create images of your heart. It provides your doctor with information about the size and shape of your heart and how well your heart's chambers and valves are working. This procedure takes approximately one hour. There are no restrictions for this procedure. Please do NOT wear cologne, perfume, aftershave, or lotions (deodorant is allowed). Please arrive 15 minutes prior to your appointment time.  Please note: We ask at that you not bring children with you during ultrasound (echo/ vascular) testing. Due to room size and safety concerns, children are not allowed in the ultrasound rooms during exams. Our front office staff cannot provide observation of children in our lobby area while testing is being conducted. An adult accompanying a patient to their appointment will only be allowed in the ultrasound room at the discretion of the ultrasound technician under special circumstances. We apologize for any inconvenience.    Follow-Up: At [facility], you and your health needs are our priority.  As part of our continuing mission to provide you with exceptional heart care, we have created designated Provider Care Teams.  These Care Teams include your  primary Cardiologist (physician) and Advanced Practice Providers (APPs -  Physician Assistants and Nurse Practitioners) who all work together to provide you with the care you need, when you need it.  We recommend signing up for the patient portal called "MyChart".  Sign up information is provided on this After Visit Summary.  MyChart is used to connect with patients for Virtual Visits (Telemedicine).  Patients are able to view lab/test results, encounter notes, upcoming appointments, etc.  Non-urgent messages can be sent to your provider as well.   To learn more about what you can do with MyChart, go to [email or web address].    Your next appointment:   6 month(s)  The format for your next appointment:   In Person  Provider:   Gypsy Balsam, MD    Other Instructions  Francee Gentile  [address] [place] [address]  [phone]

## 2023-03-02 ENCOUNTER — Ambulatory Visit (HOSPITAL_BASED_OUTPATIENT_CLINIC_OR_DEPARTMENT_OTHER)
Admission: RE | Admit: 2023-03-02 | Discharge: 2023-03-02 | Disposition: A | Discharge status: Home / Self Care | Payer: Medicare Other | Source: Ambulatory Visit | Attending: Cardiology | Admitting: Cardiology

## 2023-03-02 DIAGNOSIS — R0609 Other forms of dyspnea: Secondary | ICD-10-CM | POA: Insufficient documentation

## 2023-03-02 LAB — ECHOCARDIOGRAM COMPLETE
AR max vel: 2.23 cm2
AV Area VTI: 2.37 cm2
AV Area mean vel: 2.22 cm2
AV Mean grad: 3 mm[Hg]
AV Peak grad: 5.6 mm[Hg]
Ao pk vel: 1.19 m/s
Area-P 1/2: 4.86 cm2
Calc EF: 69.1 %
MV M vel: 4.27 m/s
MV Peak grad: 72.9 mm[Hg]
S' Lateral: 3 cm
Single Plane A2C EF: 71.4 %
Single Plane A4C EF: 68.5 %

## 2023-03-05 ENCOUNTER — Telehealth: Payer: Self-pay

## 2023-03-05 NOTE — Telephone Encounter (Signed)
-----   Message from Gypsy Balsam sent at 03/03/2023  1:28 PM EST ----- Echocardiogram showed preserved ejection fraction, no significant valvular pathology

## 2023-03-05 NOTE — Telephone Encounter (Signed)
Patient notified through my chart.

## 2023-06-25 ENCOUNTER — Other Ambulatory Visit: Payer: Self-pay | Admitting: Cardiology

## 2023-12-20 ENCOUNTER — Other Ambulatory Visit: Payer: Self-pay

## 2023-12-20 MED ORDER — AMLODIPINE BESYLATE 2.5 MG PO TABS: 2.5000 mg | ORAL_TABLET | Freq: Every day | ORAL | 0 refills | Status: DC

## 2024-01-16 ENCOUNTER — Other Ambulatory Visit: Payer: Self-pay | Admitting: Cardiology

## 2024-01-18 ENCOUNTER — Telehealth: Payer: Self-pay | Admitting: *Deleted

## 2024-01-18 MED ORDER — AMLODIPINE BESYLATE 2.5 MG PO TABS: 2.5000 mg | ORAL_TABLET | Freq: Every day | ORAL | 1 refills | Status: DC

## 2024-01-18 NOTE — Telephone Encounter (Signed)
 Rx refill sent to pharmacy.

## 2024-01-21 DIAGNOSIS — M48062 Spinal stenosis, lumbar region with neurogenic claudication: Secondary | ICD-10-CM | POA: Insufficient documentation

## 2024-01-24 ENCOUNTER — Telehealth: Payer: Self-pay | Admitting: *Deleted

## 2024-01-24 ENCOUNTER — Encounter: Payer: Self-pay | Admitting: Cardiology

## 2024-01-24 ENCOUNTER — Ambulatory Visit: Attending: Cardiology | Admitting: Cardiology

## 2024-01-24 ENCOUNTER — Encounter: Payer: Self-pay | Admitting: *Deleted

## 2024-01-24 VITALS — BP 120/62 | HR 68 | Ht 61.5 in | Wt 138.0 lb

## 2024-01-24 DIAGNOSIS — I255 Ischemic cardiomyopathy: Secondary | ICD-10-CM

## 2024-01-24 DIAGNOSIS — I2510 Atherosclerotic heart disease of native coronary artery without angina pectoris: Secondary | ICD-10-CM

## 2024-01-24 DIAGNOSIS — Z01810 Encounter for preprocedural cardiovascular examination: Secondary | ICD-10-CM

## 2024-01-24 DIAGNOSIS — I10 Essential (primary) hypertension: Secondary | ICD-10-CM

## 2024-01-24 NOTE — Patient Instructions (Signed)
 Medication Instructions:  Your physician recommends that you continue on your current medications as directed. Please refer to the Current Medication list given to you today.  *If you need a refill on your cardiac medications before your next appointment, please call your pharmacy*   Lab Work: None Ordered If you have labs (blood work) drawn today and your tests are completely normal, you will receive your results only by: MyChart Message (if you have MyChart) OR A paper copy in the mail If you have any lab test that is abnormal or we need to change your treatment, we will call you to review the results.   Testing/Procedures: Your physician has requested that you have an echocardiogram. Echocardiography is a painless test that uses sound waves to create images of your heart. It provides your doctor with information about the size and shape of your heart and how well your heart's chambers and valves are working. This procedure takes approximately one hour. There are no restrictions for this procedure. Please do NOT wear cologne, perfume, aftershave, or lotions (deodorant is allowed). Please arrive 15 minutes prior to your appointment time.  Please note: We ask at that you not bring children with you during ultrasound (echo/ vascular) testing. Due to room size and safety concerns, children are not allowed in the ultrasound rooms during exams. Our front office staff cannot provide observation of children in our lobby area while testing is being conducted. An adult accompanying a patient to their appointment will only be allowed in the ultrasound room at the discretion of the ultrasound technician under special circumstances. We apologize for any inconvenience.   Your physician has requested that you have a lexiscan myoview. For further information please visit [email or web address] Please follow instruction sheet, as given.  The test will take approximately 3 to 4 hours to complete; you may bring  reading material.  If someone comes with you to your appointment, they will need to remain in the main lobby due to limited space in the testing area.    How to prepare for your Myocardial Perfusion Test: Do not eat or drink 3 hours prior to your test, except you may have water. Do not consume products containing caffeine (regular or decaffeinated) 12 hours prior to your test. (ex: coffee, chocolate, sodas, tea). Do bring a list of your current medications with you.  If not listed below, you may take your medications as normal. Do wear comfortable clothes (no dresses or overalls) and walking shoes, tennis shoes preferred (No heels or open toe shoes are allowed). Do NOT wear cologne, perfume, aftershave, or lotions (deodorant is allowed). If these instructions are not followed, your test will have to be rescheduled.     Follow-Up: At [facility], you and your health needs are our priority.  As part of our continuing mission to provide you with exceptional heart care, we have created designated Provider Care Teams.  These Care Teams include your primary Cardiologist (physician) and Advanced Practice Providers (APPs -  Physician Assistants and Nurse Practitioners) who all work together to provide you with the care you need, when you need it.  We recommend signing up for the patient portal called "MyChart".  Sign up information is provided on this After Visit Summary.  MyChart is used to connect with patients for Virtual Visits (Telemedicine).  Patients are able to view lab/test results, encounter notes, upcoming appointments, etc.  Non-urgent messages can be sent to your provider as well.   To learn more about what you  can do with MyChart, go to [email or web address].    Your next appointment:   6 month(s)  The format for your next appointment:   In Person  Provider:   Gypsy Balsam, MD    Other Instructions NA

## 2024-01-24 NOTE — Telephone Encounter (Signed)
° °  Pre-operative Risk Assessment    Patient Name: Haley Walter  DOB: 1940/12/25 MRN: [id number]      Request for Surgical Clearance    Procedure:  Laminectomy Spine Lumbar Posterior Simple  Date of Surgery:  Clearance 03/09/24                                 Surgeon:  Sheena Horning, MD Surgeon's Group or Practice Name:  [facility] Phone number:  Not listed Fax number:  [phone]   Type of Clearance Requested:   - Medical  - Pharmacy:  Hold Aspirin D/C 5 days prior to surgery   Type of Anesthesia:  General    Additional requests/questions:    Haley Walter   01/24/2024, 1:51 PM

## 2024-01-24 NOTE — Progress Notes (Unsigned)
 Cardiology Office Note:    Date:  01/24/2024   ID:  Haley Walter, DOB 11-26-1940, MRN [id number]  PCP:  Delilah Murray HERO., MD  Cardiologist:  Lamar Fitch, MD    Referring MD: Delilah Murray HERO., MD   Chief Complaint  Patient presents with   Follow-up    History of Present Illness:    Haley Walter is a 83 y.o. female past medical history significant for coronary disease more than 20 years ago she suffered from acute myocardial infarction involving LAD territory stent has been placed on then couple weeks later she was in [place] started having chest pain and the problem completely with LAD.  Ejection fraction relatively well-preserved, also dyslipidemia arthritis.  She comes today to my office because she required back surgery.  Apparently there are some compression of the nerve so she used to go to gym on the regular basis cannot do it anymore.  She can walk around but with some difficulties.  Past Medical History:  Diagnosis Date   Actinic keratoses 01/24/2021   Anxiety    Arthritis    Cherry angioma 01/24/2021   Coronary artery disease    Coronary artery disease involving native coronary artery of native heart without angina pectoris 11/28/2014   Overview:  Completely occluded left anterior descending artery   Cystocele, unspecified 11/29/2014   Essential hypertension 02/24/2017   Gastroesophageal reflux disease without esophagitis 01/07/2016   Hamstring strain, right, initial encounter 05/07/2022   High cholesterol    Hypertension    Hypertensive retinopathy of both eyes 09/27/2019   Iron deficiency anemia 07/04/2019   Lentigo 07/24/2021   Lumbar radiculopathy 07/04/2019   MI (myocardial infarction) (HCC)    Mixed hyperlipidemia 11/28/2014   Multiple benign nevi 07/24/2021   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   Nuclear sclerotic cataract of both eyes 09/27/2019   Posterior vitreous detachment of both eyes 09/27/2019   Primary insomnia 07/01/2015    Rectocele 11/29/2014   Screening for osteoporosis 01/07/2016   Seborrheic dermatitis 01/24/2021   Seborrheic keratoses 01/24/2021   Stress incontinence in female 11/29/2014   Sun-damaged skin 07/24/2021   Trigger ring finger of right hand 10/12/2019   UTI (urinary tract infection) 11/29/2014   Vitamin D deficiency 07/01/2015   Vitreous floater, bilateral 09/27/2019    Past Surgical History:  Procedure Laterality Date   ANTERIOR (CYSTOCELE) AND POSTERIOR REPAIR (RECTOCELE) WITH XENFORM GRAFT AND SACROSPINOUS FIXATION     ANTERIOR AND POSTERIOR VAGINAL REPAIR W/ SACROSPINOUS LIGAMENT SUSPENSION     BREAST CYST EXCISION     CATARACT EXTRACTION     CORONARY ANGIOPLASTY WITH STENT PLACEMENT     INCONTINENCE SURGERY     VAGINAL HYSTERECTOMY      Current Medications: Active Medications[1]   Allergies:   Penicillins   Social History   Socioeconomic History   Marital status: Married    Spouse name: Not on file   Number of children: Not on file   Years of education: Not on file   Highest education level: Not on file  Occupational History   Not on file  Tobacco Use   Smoking status: Never   Smokeless tobacco: Never  Vaping Use   Vaping status: Never Used  Substance and Sexual Activity   Alcohol use: Yes   Drug use: No   Sexual activity: Not on file  Other Topics Concern   Not on file  Social History Narrative   Not on file   Social Drivers of Health   Tobacco  Use: Low Risk (01/24/2024)   Patient History    Smoking Tobacco Use: Never    Smokeless Tobacco Use: Never    Passive Exposure: Not on file  Financial Resource Strain: Not on file  Food Insecurity: Low Risk (04/01/2023)   Received from [facility]   Epic    Within the past 12 months, you worried that your food would run out before you got money to buy more: Never true    Within the past 12 months, the food you bought just didn't last and you didn't have money to get more. : Never true  Transportation  Needs: No Transportation Needs (04/01/2023)   Received from [facility]    In the past 12 months, has lack of reliable transportation kept you from medical appointments, meetings, work or from getting things needed for daily living? : No  Physical Activity: Not on file  Stress: Not on file  Social Connections: Not on file  Depression (EYV7-0): Not on file  Alcohol Screen: Not on file  Housing: Low Risk (04/01/2023)   Received from [facility]   Epic    What is your living situation today?: I have a steady place to live    Think about the place you live. Do you have problems with any of the following? Choose all that apply:: None/None on this list  Utilities: Low Risk (04/01/2023)   Received from [facility]   Utilities    In the past 12 months has the electric, gas, oil, or water company threatened to shut off services in your home? : No  Health Literacy: Not on file     Family History: The patient's family history includes Cancer in her mother; Heart disease in her father and maternal aunt; Stroke in her father. ROS:   Please see the history of present illness.    All 14 point review of systems negative except as described per history of present illness  EKGs/Labs/Other Studies Reviewed:    EKG Interpretation Date/Time:  Monday January 24 2024 14:23:50 EST Ventricular Rate:  68 PR Interval:  190 QRS Duration:  66 QT Interval:  392 QTC Calculation: 416 R Axis:   7  Text Interpretation: Normal sinus rhythm Low voltage QRS Nonspecific T wave abnormality When compared with ECG of 26-Jan-2023 14:39, No significant change was found Confirmed by Bernie Charleston [phone]) on 01/24/2024 2:46:40 PM    Recent Labs: No results found for requested labs within last 365 days.  Recent Lipid Panel    Component Value Date/Time   CHOL 192 12/14/2017 1205   TRIG 209 (H) 12/14/2017 1205   HDL 59 12/14/2017 1205   CHOLHDL 3.3 12/14/2017 1205   LDLCALC 91 12/14/2017  1205    Physical Exam:    VS:  BP 120/62   Pulse 68   Ht 5' 1.5 (1.562 m)   Wt 138 lb (62.6 kg)   SpO2 98%   BMI 25.65 kg/m     Wt Readings from Last 3 Encounters:  01/24/24 138 lb (62.6 kg)  01/26/23 141 lb (64 kg)  04/21/22 144 lb (65.3 kg)     GEN:  Well nourished, well developed in no acute distress HEENT: Normal NECK: No JVD; No carotid bruits LYMPHATICS: No lymphadenopathy CARDIAC: RRR, no murmurs, no rubs, no gallops RESPIRATORY:  Clear to auscultation without rales, wheezing or rhonchi  ABDOMEN: Soft, non-tender, non-distended MUSCULOSKELETAL:  No edema; No deformity  SKIN: Warm and dry LOWER EXTREMITIES: no swelling NEUROLOGIC:  Alert and oriented x 3 PSYCHIATRIC:  Normal affect   ASSESSMENT:    1. Essential hypertension   2. Coronary artery disease involving native coronary artery of native heart without angina pectoris   3. Preop cardiovascular exam   4. Ischemic cardiomyopathy    PLAN:    In order of problems listed above:  Cardiovascular preop evaluation for this lady with coronary artery disease.  Will schedule her to have echocardiogram to assess ejection fraction, will also schedule her to have Lexiscan to look at potential ischemia.  She does not have any symptoms that would suggest the problem but her ability to exercise limited. Dyslipidemia she is on Crestor  40 which I will continue.  I did review [facility] which show me data from 2019 will call primary care physician to get more up-to-date information.   Medication Adjustments/Labs and Tests Ordered: Current medicines are reviewed at length with the patient today.  Concerns regarding medicines are outlined above.  Orders Placed This Encounter  Procedures   EKG 12-Lead   Medication changes: No orders of the defined types were placed in this encounter.   Signed, Lamar DOROTHA Fitch, MD, [facility] 01/24/2024 2:47 PM    [facility]    [1]  Current Meds  Medication Sig    alendronate (FOSAMAX) 70 MG tablet Take 70 mg by mouth once a week.   amLODipine  (NORVASC ) 2.5 MG tablet Take 1 tablet (2.5 mg total) by mouth daily. Pt needs office visit before future refills.   aspirin EC 81 MG tablet Take 81 mg by mouth daily.   Calcium  Carb-Cholecalciferol (CALCIUM -VITAMIN D) 500-200 MG-UNIT tablet Take 1 tablet by mouth daily.   diclofenac sodium (VOLTAREN) 1 % GEL Apply 1 application topically 3 (three) times daily.   famotidine (PEPCID) 40 MG tablet Take 40 mg by mouth 2 (two) times daily.   fluticasone (FLONASE) 50 MCG/ACT nasal spray Place 2 sprays into both nostrils as needed for allergies or rhinitis.   gabapentin (NEURONTIN) 100 MG capsule Take 100 mg by mouth as directed. 1 tablet at lunch and 2 nightly   meloxicam (MOBIC) 7.5 MG tablet Take 7.5 mg by mouth daily.   metoprolol  succinate (TOPROL -XL) 50 MG 24 hr tablet TAKE 1 AND 1/2 TABLETS BY  MOUTH DAILY   nitroGLYCERIN  (NITROSTAT ) 0.4 MG SL tablet PLACE 1 TABLET UNDER THE TONGUE EVERY 5 MINUTES AS NEEDED FOR CHEST PAIN, AS DIRECTED   omeprazole (PRILOSEC) 20 MG capsule Take 20 mg by mouth daily.   ramipril  (ALTACE ) 10 MG capsule TAKE 1 CAPSULE BY MOUTH  DAILY   rosuvastatin  (CRESTOR ) 40 MG tablet Take 40 mg by mouth daily.   traMADol (ULTRAM) 50 MG tablet Take 50 mg by mouth every 12 (twelve) hours as needed for moderate pain (pain score 4-6).

## 2024-02-01 ENCOUNTER — Other Ambulatory Visit: Payer: Self-pay | Admitting: Cardiology

## 2024-02-01 DIAGNOSIS — I255 Ischemic cardiomyopathy: Secondary | ICD-10-CM

## 2024-02-01 DIAGNOSIS — I2510 Atherosclerotic heart disease of native coronary artery without angina pectoris: Secondary | ICD-10-CM

## 2024-02-01 DIAGNOSIS — I10 Essential (primary) hypertension: Secondary | ICD-10-CM

## 2024-02-01 DIAGNOSIS — Z01810 Encounter for preprocedural cardiovascular examination: Secondary | ICD-10-CM

## 2024-02-09 ENCOUNTER — Other Ambulatory Visit: Payer: Self-pay | Admitting: Cardiology

## 2024-02-09 DIAGNOSIS — Z01810 Encounter for preprocedural cardiovascular examination: Secondary | ICD-10-CM

## 2024-02-14 ENCOUNTER — Other Ambulatory Visit: Payer: Self-pay

## 2024-02-14 ENCOUNTER — Telehealth (HOSPITAL_COMMUNITY): Payer: Self-pay | Admitting: Radiology

## 2024-02-14 MED ORDER — AMLODIPINE BESYLATE 2.5 MG PO TABS: 2.5000 mg | ORAL_TABLET | Freq: Every day | ORAL | 2 refills | Status: AC

## 2024-02-14 NOTE — Telephone Encounter (Signed)
 Patient given detailed instructions per Myocardial Perfusion Study Information Sheet for the test on 02-18-23 at 10:30. Patient notified to arrive 15 minutes early and that it is imperative to arrive on time for appointment to keep from having the test rescheduled.  If you need to cancel or reschedule your appointment, please call the office within 24 hours of your appointment. . Patient verbalized understanding.EHK

## 2024-02-17 ENCOUNTER — Ambulatory Visit (HOSPITAL_BASED_OUTPATIENT_CLINIC_OR_DEPARTMENT_OTHER)
Admission: RE | Admit: 2024-02-17 | Discharge: 2024-02-17 | Disposition: A | Discharge status: Home / Self Care | Source: Ambulatory Visit | Attending: Cardiology | Admitting: Cardiology

## 2024-02-17 DIAGNOSIS — Z01810 Encounter for preprocedural cardiovascular examination: Secondary | ICD-10-CM | POA: Diagnosis present

## 2024-02-17 DIAGNOSIS — I2510 Atherosclerotic heart disease of native coronary artery without angina pectoris: Secondary | ICD-10-CM | POA: Diagnosis present

## 2024-02-17 DIAGNOSIS — I255 Ischemic cardiomyopathy: Secondary | ICD-10-CM | POA: Insufficient documentation

## 2024-02-17 NOTE — Addendum Note (Signed)
 Addended by: ARLOA MALLORY D on: 02/17/2024 08:38 AM   Modules accepted: Orders

## 2024-02-17 NOTE — Addendum Note (Signed)
 Addended by: BERNIE CHARLESTON on: 02/17/2024 08:40 AM   Modules accepted: Orders

## 2024-02-18 ENCOUNTER — Ambulatory Visit (HOSPITAL_COMMUNITY)
Admission: RE | Admit: 2024-02-18 | Discharge: 2024-02-18 | Disposition: A | Source: Ambulatory Visit | Attending: Cardiology | Admitting: Cardiology

## 2024-02-18 DIAGNOSIS — Z01810 Encounter for preprocedural cardiovascular examination: Secondary | ICD-10-CM | POA: Diagnosis present

## 2024-02-18 LAB — MYOCARDIAL PERFUSION IMAGING
Base ST Depression (mm): 0 mm
LV dias vol: 67 mL (ref 46–106)
LV sys vol: 21 mL
Nuc Stress EF: 69 %
Peak HR: 95 {beats}/min
Rest HR: 65 {beats}/min
Rest Nuclear Isotope Dose: 10.8 mCi
SDS: 2
SRS: 3
SSS: 5
ST Depression (mm): 0 mm
Stress Nuclear Isotope Dose: 30.8 mCi
TID: 1.42

## 2024-02-18 LAB — ECHOCARDIOGRAM COMPLETE
AR max vel: 2.65 cm2
AV Area VTI: 2.72 cm2
AV Area mean vel: 2.88 cm2
AV Mean grad: 3 mmHg
AV Peak grad: 5.4 mmHg
Ao pk vel: 1.17 m/s
Area-P 1/2: 2.76 cm2
Calc EF: 60.2 %
MV M vel: 3.69 m/s
MV Peak grad: 54.5 mmHg
S' Lateral: 2.7 cm
Single Plane A2C EF: 60 %
Single Plane A4C EF: 62.3 %

## 2024-02-18 MED ORDER — REGADENOSON 0.4 MG/5ML IV SOLN
0.4000 mg | Freq: Once | INTRAVENOUS | Status: AC
  Administered 2024-02-18: 0.4 mg via INTRAVENOUS

## 2024-02-18 MED ORDER — REGADENOSON 0.4 MG/5ML IV SOLN
INTRAVENOUS | Status: AC
  Filled 2024-02-18: qty 5

## 2024-02-18 MED ORDER — TECHNETIUM TC 99M TETROFOSMIN IV KIT
30.8000 | PACK | Freq: Once | INTRAVENOUS | Status: AC | PRN
  Administered 2024-02-18: 30.8 via INTRAVENOUS

## 2024-02-18 MED ORDER — TECHNETIUM TC 99M TETROFOSMIN IV KIT
10.8000 | PACK | Freq: Once | INTRAVENOUS | Status: AC | PRN
  Administered 2024-02-18: 10.8 via INTRAVENOUS

## 2024-02-21 ENCOUNTER — Ambulatory Visit: Payer: Self-pay | Admitting: Cardiology

## 2024-02-21 NOTE — Telephone Encounter (Signed)
 Spoke to Tor[place] at [facility] and informed her that the patient was still doing testing for her upcoming procedure that the doctor ordered. She informed me that the patient is scheduled to get a stress test and I do not see this in the orders. Will consult with Dr. Karry nurse.

## 2024-02-21 NOTE — Telephone Encounter (Signed)
 Almarie called to check on status of clearance.

## 2024-02-22 ENCOUNTER — Telehealth: Payer: Self-pay

## 2024-02-22 NOTE — Telephone Encounter (Signed)
 Left message on My Chart with Echo results per Dr. Karry note. Routed to PCP.

## 2024-02-22 NOTE — Telephone Encounter (Signed)
"  ° °  Primary Cardiologist: Lamar Fitch, MD  Chart reviewed as part of pre-operative protocol coverage. Given past medical history and time since last visit, based on ACC/AHA guidelines, Haley Walter would be at acceptable risk for the planned procedure without further cardiovascular testing.   Patient should contact our office if she is having new symptoms that are concerning from a cardiac perspective to arrange a follow-up appointment.    Ideally aspirin should be continued without interruption, however if the bleeding risk is too great, aspirin may be held for 5-7 days prior to surgery. Please resume aspirin post operatively when it is felt to be safe from a bleeding standpoint.   I will route this recommendation to the requesting party via Epic fax function and remove from pre-op pool.  Please call with questions.  Haley EMERSON Bane, NP-C 02/22/2024, 9:16 AM [address], [address] [place], [place] [address] Office [phone] Fax [phone]     "

## 2024-02-22 NOTE — Telephone Encounter (Signed)
 Krasowski, Robert J, MD to Arloa Olam BIRCH, RN    02/21/24 10:50 AM Result Note Stress test showing no ischemia chest wall MI, stable MYOCARDIAL PERFUSION IMAGING

## 2024-02-22 NOTE — Telephone Encounter (Signed)
 Left message on My Chart with stress test results per Dr. Tonja Fray note. Routed to PCP.

## 2024-02-24 ENCOUNTER — Telehealth: Payer: Self-pay

## 2024-02-24 NOTE — Telephone Encounter (Signed)
 Pt viewed Echo results on My Chart per Dr. Vanetta Shawl note. Routed to PCP.

## 2024-02-24 NOTE — Telephone Encounter (Signed)
 Pt viewed stress test results on My Chart per Dr. Karry note. Routed to PCP.

## 2024-02-28 ENCOUNTER — Ambulatory Visit: Payer: Self-pay | Admitting: Cardiology

## 2024-03-03 NOTE — Telephone Encounter (Signed)
 MyChart message read on 03/02/24 regarding stress test results.  Alan, RN
